# Patient Record
Sex: Female | Born: 1960 | Race: White | Hispanic: No | Marital: Married | Smoking: Former smoker
Health system: Southern US, Community
[De-identification: ages and names within clinical notes are randomized; demographics above are authoritative.]

## PROBLEM LIST (undated history)

## (undated) DIAGNOSIS — D45 Polycythemia vera: Secondary | ICD-10-CM

## (undated) HISTORY — PX: APPENDECTOMY: SHX54

## (undated) HISTORY — PX: TONSILLECTOMY: SUR1361

## (undated) HISTORY — DX: Polycythemia vera: D45

---

## 2007-04-08 ENCOUNTER — Ambulatory Visit: Payer: Self-pay | Admitting: Internal Medicine

## 2009-12-15 ENCOUNTER — Ambulatory Visit: Payer: Self-pay | Admitting: Internal Medicine

## 2009-12-16 ENCOUNTER — Ambulatory Visit: Payer: Self-pay | Admitting: Internal Medicine

## 2010-01-14 ENCOUNTER — Ambulatory Visit: Payer: Self-pay | Admitting: Internal Medicine

## 2010-02-14 ENCOUNTER — Ambulatory Visit: Payer: Self-pay | Admitting: Internal Medicine

## 2010-03-16 ENCOUNTER — Ambulatory Visit: Payer: Self-pay | Admitting: Internal Medicine

## 2010-03-28 ENCOUNTER — Ambulatory Visit: Payer: Self-pay | Admitting: Internal Medicine

## 2010-04-16 ENCOUNTER — Ambulatory Visit: Payer: Self-pay | Admitting: Internal Medicine

## 2010-05-23 ENCOUNTER — Ambulatory Visit: Payer: Self-pay | Admitting: Internal Medicine

## 2010-06-15 ENCOUNTER — Ambulatory Visit: Payer: Self-pay | Admitting: Internal Medicine

## 2010-07-25 ENCOUNTER — Ambulatory Visit: Payer: Self-pay | Admitting: Internal Medicine

## 2010-08-15 ENCOUNTER — Ambulatory Visit: Payer: Self-pay | Admitting: Internal Medicine

## 2010-09-15 ENCOUNTER — Ambulatory Visit: Payer: Self-pay | Admitting: Internal Medicine

## 2010-12-05 ENCOUNTER — Ambulatory Visit: Payer: Self-pay | Admitting: Internal Medicine

## 2010-12-16 ENCOUNTER — Ambulatory Visit: Payer: Self-pay | Admitting: Internal Medicine

## 2011-03-07 ENCOUNTER — Ambulatory Visit: Payer: Self-pay | Admitting: Internal Medicine

## 2011-03-17 ENCOUNTER — Ambulatory Visit: Payer: Self-pay | Admitting: Internal Medicine

## 2011-05-14 ENCOUNTER — Ambulatory Visit: Payer: Self-pay | Admitting: Internal Medicine

## 2011-06-07 ENCOUNTER — Ambulatory Visit: Payer: Self-pay | Admitting: Internal Medicine

## 2011-06-07 LAB — CBC CANCER CENTER
Basophil #: 0.1 x10 3/mm (ref 0.0–0.1)
Basophil %: 0.9 %
Eosinophil #: 0.1 x10 3/mm (ref 0.0–0.7)
HCT: 42.9 % (ref 35.0–47.0)
HGB: 14.1 g/dL (ref 12.0–16.0)
Lymphocyte #: 1 x10 3/mm (ref 1.0–3.6)
MCH: 34.3 pg — ABNORMAL HIGH (ref 26.0–34.0)
MCHC: 32.8 g/dL (ref 32.0–36.0)
MCV: 104.4 fL — ABNORMAL HIGH (ref 80–100)
Monocyte #: 0.3 x10 3/mm (ref 0.0–0.7)
Monocyte %: 5.4 %
Neutrophil #: 4.9 x10 3/mm (ref 1.4–6.5)
RDW: 12.3 % (ref 11.5–14.5)
WBC: 6.4 x10 3/mm (ref 3.6–11.0)

## 2011-06-07 LAB — COMPREHENSIVE METABOLIC PANEL
Alkaline Phosphatase: 88 U/L (ref 50–136)
Bilirubin,Total: 0.9 mg/dL (ref 0.2–1.0)
Calcium, Total: 8.7 mg/dL (ref 8.5–10.1)
Co2: 31 mmol/L (ref 21–32)
Creatinine: 0.8 mg/dL (ref 0.60–1.30)
EGFR (Non-African Amer.): >60
Glucose: 82 mg/dL (ref 65–99)
SGPT (ALT): 47 U/L
Sodium: 140 mmol/L (ref 136–145)
Total Protein: 6.8 g/dL (ref 6.4–8.2)

## 2011-06-15 ENCOUNTER — Ambulatory Visit: Payer: Self-pay | Admitting: Internal Medicine

## 2011-07-19 ENCOUNTER — Ambulatory Visit: Payer: Self-pay | Admitting: Internal Medicine

## 2011-07-19 LAB — CBC CANCER CENTER
Eosinophil #: 0.1 x10 3/mm (ref 0.0–0.7)
HCT: 44.7 % (ref 35.0–47.0)
Lymphocyte #: 0.9 x10 3/mm — ABNORMAL LOW (ref 1.0–3.6)
MCH: 34.7 pg — ABNORMAL HIGH (ref 26.0–34.0)
MCV: 104.4 fL — ABNORMAL HIGH (ref 80–100)
Monocyte #: 0.4 x10 3/mm (ref 0.0–0.7)
Neutrophil %: 79.1 %
Platelet: 439 x10 3/mm (ref 150–440)
RBC: 4.28 10*6/uL (ref 3.80–5.20)
RDW: 12.3 % (ref 11.5–14.5)
WBC: 7.3 x10 3/mm (ref 3.6–11.0)

## 2011-08-15 ENCOUNTER — Ambulatory Visit: Payer: Self-pay | Admitting: Internal Medicine

## 2011-08-30 LAB — CBC WITH DIFFERENTIAL/PLATELET
Eosinophil %: 1.7 %
HGB: 14.3 g/dL (ref 12.0–16.0)
Lymphocyte #: 1 10*3/uL (ref 1.0–3.6)
Lymphocyte %: 14.8 %
MCHC: 34.9 g/dL (ref 32.0–36.0)
MCV: 103 fL — ABNORMAL HIGH (ref 80–100)
Monocyte #: 0.4 x10 3/mm (ref 0.2–0.9)
Monocyte %: 6 %
RDW: 12.5 % (ref 11.5–14.5)

## 2011-08-30 LAB — HEPATIC FUNCTION PANEL A (ARMC)
Albumin: 3.8 g/dL (ref 3.4–5.0)
Alkaline Phosphatase: 99 U/L (ref 50–136)
Bilirubin,Total: 0.8 mg/dL (ref 0.2–1.0)
SGOT(AST): 25 U/L (ref 15–37)
Total Protein: 6.7 g/dL (ref 6.4–8.2)

## 2011-08-30 LAB — CREATININE, SERUM
Creatinine: 0.79 mg/dL (ref 0.60–1.30)
EGFR (Non-African Amer.): >60

## 2011-09-15 ENCOUNTER — Ambulatory Visit: Payer: Self-pay | Admitting: Internal Medicine

## 2011-10-11 LAB — CBC CANCER CENTER
Basophil #: 0.1 x10 3/mm (ref 0.0–0.1)
Basophil %: 0.8 %
Eosinophil %: 1.2 %
HCT: 42.1 % (ref 35.0–47.0)
MCHC: 34.6 g/dL (ref 32.0–36.0)
MCV: 104 fL — ABNORMAL HIGH (ref 80–100)
Monocyte #: 0.5 x10 3/mm (ref 0.2–0.9)
Monocyte %: 6.5 %
Neutrophil #: 5.6 x10 3/mm (ref 1.4–6.5)
Neutrophil %: 78.4 %
RBC: 4.05 10*6/uL (ref 3.80–5.20)
RDW: 12.8 % (ref 11.5–14.5)
WBC: 7.1 x10 3/mm (ref 3.6–11.0)

## 2011-10-15 ENCOUNTER — Ambulatory Visit: Payer: Self-pay | Admitting: Internal Medicine

## 2011-11-22 ENCOUNTER — Ambulatory Visit: Payer: Self-pay | Admitting: Internal Medicine

## 2011-11-22 LAB — CBC CANCER CENTER
Basophil %: 0.7 %
Eosinophil %: 1.4 %
HCT: 43.4 % (ref 35.0–47.0)
HGB: 14.7 g/dL (ref 12.0–16.0)
Lymphocyte #: 0.9 x10 3/mm — ABNORMAL LOW (ref 1.0–3.6)
MCH: 35.2 pg — ABNORMAL HIGH (ref 26.0–34.0)
MCV: 104 fL — ABNORMAL HIGH (ref 80–100)
Monocyte #: 0.3 x10 3/mm (ref 0.2–0.9)
Monocyte %: 4.6 %
Platelet: 440 x10 3/mm (ref 150–440)
RBC: 4.17 10*6/uL (ref 3.80–5.20)
WBC: 7.3 x10 3/mm (ref 3.6–11.0)

## 2011-11-22 LAB — COMPREHENSIVE METABOLIC PANEL
Alkaline Phosphatase: 88 U/L (ref 50–136)
Anion Gap: 4 — ABNORMAL LOW (ref 7–16)
BUN: 13 mg/dL (ref 7–18)
Calcium, Total: 9 mg/dL (ref 8.5–10.1)
Chloride: 103 mmol/L (ref 98–107)
Co2: 31 mmol/L (ref 21–32)
Creatinine: 0.84 mg/dL (ref 0.60–1.30)
EGFR (African American): >60
EGFR (Non-African Amer.): >60
Osmolality: 277 (ref 275–301)
Potassium: 4.4 mmol/L (ref 3.5–5.1)
Sodium: 138 mmol/L (ref 136–145)
Total Protein: 6.9 g/dL (ref 6.4–8.2)

## 2011-12-16 ENCOUNTER — Ambulatory Visit: Payer: Self-pay | Admitting: Internal Medicine

## 2012-01-03 LAB — CBC CANCER CENTER
Eosinophil #: 0.1 x10 3/mm (ref 0.0–0.7)
Eosinophil %: 1 %
HGB: 14.6 g/dL (ref 12.0–16.0)
Lymphocyte #: 1 x10 3/mm (ref 1.0–3.6)
MCH: 35 pg — ABNORMAL HIGH (ref 26.0–34.0)
MCV: 102 fL — ABNORMAL HIGH (ref 80–100)
Monocyte #: 0.5 x10 3/mm (ref 0.2–0.9)
Neutrophil #: 6 x10 3/mm (ref 1.4–6.5)
Neutrophil %: 78.7 %
Platelet: 405 x10 3/mm (ref 150–440)
WBC: 7.6 x10 3/mm (ref 3.6–11.0)

## 2012-01-15 ENCOUNTER — Ambulatory Visit: Payer: Self-pay | Admitting: Internal Medicine

## 2012-02-14 LAB — CBC CANCER CENTER
Basophil #: 0.1 x10 3/mm (ref 0.0–0.1)
Basophil %: 0.7 %
Eosinophil #: 0.1 x10 3/mm (ref 0.0–0.7)
HCT: 42.5 % (ref 35.0–47.0)
HGB: 14.7 g/dL (ref 12.0–16.0)
Lymphocyte #: 1.1 x10 3/mm (ref 1.0–3.6)
Lymphocyte %: 13.5 %
MCH: 35.3 pg — ABNORMAL HIGH (ref 26.0–34.0)
MCHC: 34.5 g/dL (ref 32.0–36.0)
MCV: 103 fL — ABNORMAL HIGH (ref 80–100)
Monocyte #: 0.5 x10 3/mm (ref 0.2–0.9)
Neutrophil #: 6.2 x10 3/mm (ref 1.4–6.5)
RDW: 12.9 % (ref 11.5–14.5)

## 2012-02-14 LAB — CREATININE, SERUM: Creatinine: 0.83 mg/dL (ref 0.60–1.30)

## 2012-02-14 LAB — HEPATIC FUNCTION PANEL A (ARMC)
Alkaline Phosphatase: 75 U/L (ref 50–136)
Bilirubin, Direct: 0.2 mg/dL (ref 0.00–0.20)
Bilirubin,Total: 0.6 mg/dL (ref 0.2–1.0)
Total Protein: 6.8 g/dL (ref 6.4–8.2)

## 2012-02-15 ENCOUNTER — Ambulatory Visit: Payer: Self-pay | Admitting: Internal Medicine

## 2012-03-27 ENCOUNTER — Ambulatory Visit: Payer: Self-pay | Admitting: Internal Medicine

## 2012-03-27 LAB — CBC CANCER CENTER
Basophil %: 0.8 %
Eosinophil #: 0.1 x10 3/mm (ref 0.0–0.7)
Eosinophil %: 1.8 %
HGB: 15.1 g/dL (ref 12.0–16.0)
Lymphocyte %: 14.6 %
MCHC: 34.6 g/dL (ref 32.0–36.0)
Neutrophil %: 77.1 %
RBC: 4.35 10*6/uL (ref 3.80–5.20)
WBC: 6.8 x10 3/mm (ref 3.6–11.0)

## 2012-04-16 ENCOUNTER — Ambulatory Visit: Payer: Self-pay | Admitting: Internal Medicine

## 2012-05-08 LAB — COMPREHENSIVE METABOLIC PANEL
Albumin: 3.9 g/dL (ref 3.4–5.0)
Anion Gap: 6 — ABNORMAL LOW (ref 7–16)
BUN: 14 mg/dL (ref 7–18)
Bilirubin,Total: 0.7 mg/dL (ref 0.2–1.0)
Chloride: 104 mmol/L (ref 98–107)
EGFR (Non-African Amer.): >60
Glucose: 118 mg/dL — ABNORMAL HIGH (ref 65–99)
Potassium: 4.5 mmol/L (ref 3.5–5.1)
SGPT (ALT): 44 U/L (ref 12–78)
Sodium: 141 mmol/L (ref 136–145)

## 2012-05-08 LAB — CBC CANCER CENTER
Basophil #: 0.1 x10 3/mm (ref 0.0–0.1)
Basophil %: 0.8 %
MCH: 35.4 pg — ABNORMAL HIGH (ref 26.0–34.0)
MCV: 101 fL — ABNORMAL HIGH (ref 80–100)
Monocyte #: 0.4 x10 3/mm (ref 0.2–0.9)
Monocyte %: 5.6 %
RBC: 4.18 10*6/uL (ref 3.80–5.20)
RDW: 13.1 % (ref 11.5–14.5)

## 2012-05-17 ENCOUNTER — Ambulatory Visit: Payer: Self-pay | Admitting: Internal Medicine

## 2012-05-23 ENCOUNTER — Ambulatory Visit: Payer: Self-pay | Admitting: Internal Medicine

## 2012-06-19 ENCOUNTER — Ambulatory Visit: Payer: Self-pay | Admitting: Internal Medicine

## 2012-06-19 LAB — CBC CANCER CENTER
Basophil #: 0.1 x10 3/mm (ref 0.0–0.1)
Basophil %: 0.8 %
Eosinophil %: 1.3 %
HCT: 42.8 % (ref 35.0–47.0)
HGB: 14.9 g/dL (ref 12.0–16.0)
Lymphocyte %: 12.3 %
MCHC: 34.8 g/dL (ref 32.0–36.0)
Monocyte %: 4.8 %
Neutrophil #: 5.3 x10 3/mm (ref 1.4–6.5)
WBC: 6.5 x10 3/mm (ref 3.6–11.0)

## 2012-07-15 ENCOUNTER — Ambulatory Visit: Payer: Self-pay | Admitting: Internal Medicine

## 2012-07-31 LAB — CBC CANCER CENTER
Basophil #: 0 x10 3/mm (ref 0.0–0.1)
Basophil %: 0.5 %
Eosinophil #: 0.1 x10 3/mm (ref 0.0–0.7)
HCT: 43.3 % (ref 35.0–47.0)
Lymphocyte #: 1 x10 3/mm (ref 1.0–3.6)
MCHC: 34.1 g/dL (ref 32.0–36.0)
MCV: 103 fL — ABNORMAL HIGH (ref 80–100)
Monocyte #: 0.4 x10 3/mm (ref 0.2–0.9)
Monocyte %: 5.7 %
Neutrophil %: 78.5 %
RBC: 4.21 10*6/uL (ref 3.80–5.20)
RDW: 12.8 % (ref 11.5–14.5)
WBC: 7.2 x10 3/mm (ref 3.6–11.0)

## 2012-07-31 LAB — HEPATIC FUNCTION PANEL A (ARMC)
Albumin: 3.9 g/dL (ref 3.4–5.0)
Alkaline Phosphatase: 82 U/L (ref 50–136)
SGPT (ALT): 50 U/L (ref 12–78)
Total Protein: 6.8 g/dL (ref 6.4–8.2)

## 2012-07-31 LAB — CREATININE, SERUM: Creatinine: 0.8 mg/dL (ref 0.60–1.30)

## 2012-08-14 ENCOUNTER — Ambulatory Visit: Payer: Self-pay | Admitting: Internal Medicine

## 2012-09-11 LAB — CBC CANCER CENTER
Basophil #: 0 x10 3/mm (ref 0.0–0.1)
Basophil %: 0.6 %
Eosinophil #: 0.1 x10 3/mm (ref 0.0–0.7)
Eosinophil %: 1.4 %
HCT: 42.5 % (ref 35.0–47.0)
HGB: 14.3 g/dL (ref 12.0–16.0)
Lymphocyte #: 0.9 x10 3/mm — ABNORMAL LOW (ref 1.0–3.6)
MCHC: 33.7 g/dL (ref 32.0–36.0)
MCV: 103 fL — ABNORMAL HIGH (ref 80–100)
Neutrophil #: 5.6 x10 3/mm (ref 1.4–6.5)
Neutrophil %: 79.4 %
RBC: 4.14 10*6/uL (ref 3.80–5.20)
RDW: 12.2 % (ref 11.5–14.5)
WBC: 7 x10 3/mm (ref 3.6–11.0)

## 2012-09-14 ENCOUNTER — Ambulatory Visit: Payer: Self-pay | Admitting: Internal Medicine

## 2012-10-23 ENCOUNTER — Ambulatory Visit: Payer: Self-pay | Admitting: Internal Medicine

## 2012-10-23 LAB — CBC CANCER CENTER
Basophil #: 0 x10 3/mm (ref 0.0–0.1)
Basophil %: 0.4 %
HCT: 44.8 % (ref 35.0–47.0)
HGB: 15.5 g/dL (ref 12.0–16.0)
Lymphocyte #: 1 x10 3/mm (ref 1.0–3.6)
Lymphocyte %: 12.6 %
MCH: 35.3 pg — ABNORMAL HIGH (ref 26.0–34.0)
MCHC: 34.6 g/dL (ref 32.0–36.0)
MCV: 102 fL — ABNORMAL HIGH (ref 80–100)
Monocyte #: 0.4 x10 3/mm (ref 0.2–0.9)
Neutrophil #: 6.4 x10 3/mm (ref 1.4–6.5)
Platelet: 428 x10 3/mm (ref 150–440)
RBC: 4.39 10*6/uL (ref 3.80–5.20)
RDW: 12.4 % (ref 11.5–14.5)
WBC: 8 x10 3/mm (ref 3.6–11.0)

## 2012-10-23 LAB — COMPREHENSIVE METABOLIC PANEL
Alkaline Phosphatase: 69 U/L (ref 50–136)
Calcium, Total: 8.8 mg/dL (ref 8.5–10.1)
Chloride: 105 mmol/L (ref 98–107)
Co2: 30 mmol/L (ref 21–32)
Creatinine: 0.89 mg/dL (ref 0.60–1.30)
EGFR (African American): >60
EGFR (Non-African Amer.): >60
Osmolality: 282 (ref 275–301)
SGOT(AST): 19 U/L (ref 15–37)
Sodium: 142 mmol/L (ref 136–145)
Total Protein: 7.1 g/dL (ref 6.4–8.2)

## 2012-11-14 ENCOUNTER — Ambulatory Visit: Payer: Self-pay | Admitting: Internal Medicine

## 2012-12-04 LAB — CBC CANCER CENTER
Basophil #: 0.1 x10 3/mm (ref 0.0–0.1)
Basophil %: 0.9 %
Eosinophil #: 0.1 x10 3/mm (ref 0.0–0.7)
Eosinophil %: 1.7 %
HCT: 42.4 % (ref 35.0–47.0)
HGB: 14.6 g/dL (ref 12.0–16.0)
Lymphocyte #: 1.4 x10 3/mm (ref 1.0–3.6)
Lymphocyte %: 21.3 %
MCH: 34.6 pg — ABNORMAL HIGH (ref 26.0–34.0)
MCHC: 34.4 g/dL (ref 32.0–36.0)
MCV: 101 fL — ABNORMAL HIGH (ref 80–100)
Monocyte #: 0.6 x10 3/mm (ref 0.2–0.9)
Neutrophil %: 67.3 %
RBC: 4.22 10*6/uL (ref 3.80–5.20)
RDW: 12.9 % (ref 11.5–14.5)
WBC: 6.4 x10 3/mm (ref 3.6–11.0)

## 2012-12-15 ENCOUNTER — Ambulatory Visit: Payer: Self-pay | Admitting: Internal Medicine

## 2013-01-15 ENCOUNTER — Ambulatory Visit: Payer: Self-pay | Admitting: Internal Medicine

## 2013-01-15 LAB — CBC CANCER CENTER
Basophil #: 0.1 x10 3/mm (ref 0.0–0.1)
Basophil %: 0.9 %
HCT: 45.5 % (ref 35.0–47.0)
HGB: 15.6 g/dL (ref 12.0–16.0)
Lymphocyte #: 1.3 x10 3/mm (ref 1.0–3.6)
Lymphocyte %: 15.7 %
MCV: 100 fL (ref 80–100)
Monocyte %: 6.1 %
Neutrophil %: 75.6 %
WBC: 8.3 x10 3/mm (ref 3.6–11.0)

## 2013-01-15 LAB — CREATININE, SERUM
Creatinine: 0.86 mg/dL (ref 0.60–1.30)
EGFR (African American): >60

## 2013-01-15 LAB — HEPATIC FUNCTION PANEL A (ARMC)
Albumin: 3.9 g/dL (ref 3.4–5.0)
Alkaline Phosphatase: 83 U/L (ref 50–136)
Bilirubin, Direct: 0.1 mg/dL (ref 0.00–0.20)
Bilirubin,Total: 0.6 mg/dL (ref 0.2–1.0)
Total Protein: 6.9 g/dL (ref 6.4–8.2)

## 2013-02-14 ENCOUNTER — Ambulatory Visit: Payer: Self-pay | Admitting: Internal Medicine

## 2013-02-26 LAB — CBC CANCER CENTER
Basophil %: 0.7 %
Eosinophil #: 0.1 x10 3/mm (ref 0.0–0.7)
Eosinophil %: 1.1 %
HGB: 15.8 g/dL (ref 12.0–16.0)
Lymphocyte #: 1.1 x10 3/mm (ref 1.0–3.6)
Lymphocyte %: 10.9 %
MCH: 33.6 pg (ref 26.0–34.0)
MCV: 101 fL — ABNORMAL HIGH (ref 80–100)
Monocyte #: 0.6 x10 3/mm (ref 0.2–0.9)
Neutrophil #: 8.3 x10 3/mm — ABNORMAL HIGH (ref 1.4–6.5)
RDW: 12.2 % (ref 11.5–14.5)
WBC: 10.2 x10 3/mm (ref 3.6–11.0)

## 2013-03-16 ENCOUNTER — Ambulatory Visit: Payer: Self-pay | Admitting: Internal Medicine

## 2013-04-23 ENCOUNTER — Ambulatory Visit: Payer: Self-pay | Admitting: Internal Medicine

## 2013-04-23 LAB — COMPREHENSIVE METABOLIC PANEL
ANION GAP: 7 (ref 7–16)
AST: 22 U/L (ref 15–37)
Albumin: 4 g/dL (ref 3.4–5.0)
Alkaline Phosphatase: 87 U/L
BILIRUBIN TOTAL: 0.5 mg/dL (ref 0.2–1.0)
BUN: 13 mg/dL (ref 7–18)
Calcium, Total: 9.3 mg/dL (ref 8.5–10.1)
Chloride: 102 mmol/L (ref 98–107)
Co2: 32 mmol/L (ref 21–32)
Creatinine: 0.79 mg/dL (ref 0.60–1.30)
EGFR (African American): >60
EGFR (Non-African Amer.): >60
Glucose: 101 mg/dL — ABNORMAL HIGH (ref 65–99)
OSMOLALITY: 282 (ref 275–301)
Potassium: 4.1 mmol/L (ref 3.5–5.1)
SGPT (ALT): 40 U/L (ref 12–78)
SODIUM: 141 mmol/L (ref 136–145)
Total Protein: 6.9 g/dL (ref 6.4–8.2)

## 2013-04-23 LAB — CBC CANCER CENTER
BASOS ABS: 0.1 x10 3/mm (ref 0.0–0.1)
BASOS PCT: 0.7 %
Eosinophil #: 0.2 x10 3/mm (ref 0.0–0.7)
Eosinophil %: 2.5 %
HCT: 45.7 % (ref 35.0–47.0)
HGB: 15.4 g/dL (ref 12.0–16.0)
Lymphocyte #: 1.3 x10 3/mm (ref 1.0–3.6)
Lymphocyte %: 16.2 %
MCH: 33.1 pg (ref 26.0–34.0)
MCHC: 33.7 g/dL (ref 32.0–36.0)
MCV: 98 fL (ref 80–100)
Monocyte #: 0.5 x10 3/mm (ref 0.2–0.9)
Monocyte %: 6.3 %
NEUTROS PCT: 74.3 %
Neutrophil #: 6 x10 3/mm (ref 1.4–6.5)
PLATELETS: 476 x10 3/mm — AB (ref 150–440)
RBC: 4.67 10*6/uL (ref 3.80–5.20)
RDW: 12.8 % (ref 11.5–14.5)
WBC: 8.1 x10 3/mm (ref 3.6–11.0)

## 2013-05-14 LAB — CBC CANCER CENTER
Basophil #: 0.1 x10 3/mm (ref 0.0–0.1)
Basophil %: 1.2 %
EOS ABS: 0.2 x10 3/mm (ref 0.0–0.7)
Eosinophil %: 2.3 %
HCT: 43.7 % (ref 35.0–47.0)
HGB: 14.8 g/dL (ref 12.0–16.0)
LYMPHS ABS: 1.1 x10 3/mm (ref 1.0–3.6)
Lymphocyte %: 17 %
MCH: 32.9 pg (ref 26.0–34.0)
MCHC: 33.8 g/dL (ref 32.0–36.0)
MCV: 97 fL (ref 80–100)
MONO ABS: 0.4 x10 3/mm (ref 0.2–0.9)
MONOS PCT: 5.9 %
NEUTROS ABS: 4.9 x10 3/mm (ref 1.4–6.5)
NEUTROS PCT: 73.6 %
Platelet: 294 x10 3/mm (ref 150–440)
RBC: 4.49 10*6/uL (ref 3.80–5.20)
RDW: 13.2 % (ref 11.5–14.5)
WBC: 6.7 x10 3/mm (ref 3.6–11.0)

## 2013-05-17 ENCOUNTER — Ambulatory Visit: Payer: Self-pay | Admitting: Internal Medicine

## 2013-05-25 ENCOUNTER — Ambulatory Visit: Payer: Self-pay | Admitting: Internal Medicine

## 2013-06-04 ENCOUNTER — Ambulatory Visit: Payer: Self-pay | Admitting: Internal Medicine

## 2013-06-04 LAB — CBC CANCER CENTER
Basophil #: 0.1 x10 3/mm (ref 0.0–0.1)
Basophil %: 0.8 %
Eosinophil #: 0.2 x10 3/mm (ref 0.0–0.7)
Eosinophil %: 2 %
HCT: 42.8 % (ref 35.0–47.0)
HGB: 14.3 g/dL (ref 12.0–16.0)
Lymphocyte #: 1.1 x10 3/mm (ref 1.0–3.6)
Lymphocyte %: 13.7 %
MCH: 32.2 pg (ref 26.0–34.0)
MCHC: 33.3 g/dL (ref 32.0–36.0)
MCV: 97 fL (ref 80–100)
MONO ABS: 0.5 x10 3/mm (ref 0.2–0.9)
Monocyte %: 6.8 %
Neutrophil #: 6.2 x10 3/mm (ref 1.4–6.5)
Neutrophil %: 76.7 %
PLATELETS: 298 x10 3/mm (ref 150–440)
RBC: 4.43 10*6/uL (ref 3.80–5.20)
RDW: 13.3 % (ref 11.5–14.5)
WBC: 8 x10 3/mm (ref 3.6–11.0)

## 2013-06-14 ENCOUNTER — Ambulatory Visit: Payer: Self-pay | Admitting: Internal Medicine

## 2013-06-16 ENCOUNTER — Ambulatory Visit: Payer: Self-pay | Admitting: Gastroenterology

## 2013-06-17 LAB — PATHOLOGY REPORT

## 2013-06-25 LAB — CBC CANCER CENTER
Basophil #: 0.1 x10 3/mm (ref 0.0–0.1)
Basophil %: 0.7 %
EOS ABS: 0.1 x10 3/mm (ref 0.0–0.7)
EOS PCT: 1.5 %
HCT: 44.4 % (ref 35.0–47.0)
HGB: 14.5 g/dL (ref 12.0–16.0)
LYMPHS ABS: 1 x10 3/mm (ref 1.0–3.6)
Lymphocyte %: 13.5 %
MCH: 31.3 pg (ref 26.0–34.0)
MCHC: 32.6 g/dL (ref 32.0–36.0)
MCV: 96 fL (ref 80–100)
Monocyte #: 0.5 x10 3/mm (ref 0.2–0.9)
Monocyte %: 6.3 %
NEUTROS PCT: 78 %
Neutrophil #: 5.7 x10 3/mm (ref 1.4–6.5)
Platelet: 296 x10 3/mm (ref 150–440)
RBC: 4.62 10*6/uL (ref 3.80–5.20)
RDW: 13.1 % (ref 11.5–14.5)
WBC: 7.3 x10 3/mm (ref 3.6–11.0)

## 2013-07-15 ENCOUNTER — Ambulatory Visit: Payer: Self-pay | Admitting: Internal Medicine

## 2013-07-16 LAB — CBC CANCER CENTER
BASOS PCT: 0.7 %
Basophil #: 0.1 x10 3/mm (ref 0.0–0.1)
EOS ABS: 0.1 x10 3/mm (ref 0.0–0.7)
Eosinophil %: 2 %
HCT: 42.7 % (ref 35.0–47.0)
HGB: 13.9 g/dL (ref 12.0–16.0)
LYMPHS ABS: 1.1 x10 3/mm (ref 1.0–3.6)
LYMPHS PCT: 14.7 %
MCH: 31 pg (ref 26.0–34.0)
MCHC: 32.7 g/dL (ref 32.0–36.0)
MCV: 95 fL (ref 80–100)
MONO ABS: 0.4 x10 3/mm (ref 0.2–0.9)
Monocyte %: 5.7 %
NEUTROS ABS: 5.7 x10 3/mm (ref 1.4–6.5)
NEUTROS PCT: 76.9 %
Platelet: 254 x10 3/mm (ref 150–440)
RBC: 4.49 10*6/uL (ref 3.80–5.20)
RDW: 13.8 % (ref 11.5–14.5)
WBC: 7.4 x10 3/mm (ref 3.6–11.0)

## 2013-07-16 LAB — HEPATIC FUNCTION PANEL A (ARMC)
ALK PHOS: 74 U/L
ALT: 28 U/L (ref 12–78)
Albumin: 3.6 g/dL (ref 3.4–5.0)
BILIRUBIN DIRECT: 0.1 mg/dL (ref 0.00–0.20)
BILIRUBIN TOTAL: 0.4 mg/dL (ref 0.2–1.0)
SGOT(AST): 17 U/L (ref 15–37)
Total Protein: 6.5 g/dL (ref 6.4–8.2)

## 2013-07-16 LAB — CREATININE, SERUM
Creatinine: 0.74 mg/dL (ref 0.60–1.30)
EGFR (African American): >60
EGFR (Non-African Amer.): >60

## 2013-08-06 LAB — CBC CANCER CENTER
Basophil #: 0.1 x10 3/mm (ref 0.0–0.1)
Basophil %: 1 %
Eosinophil #: 0.2 x10 3/mm (ref 0.0–0.7)
Eosinophil %: 1.8 %
HCT: 45.3 % (ref 35.0–47.0)
HGB: 14.7 g/dL (ref 12.0–16.0)
LYMPHS PCT: 14.9 %
Lymphocyte #: 1.3 x10 3/mm (ref 1.0–3.6)
MCH: 31.2 pg (ref 26.0–34.0)
MCHC: 32.4 g/dL (ref 32.0–36.0)
MCV: 96 fL (ref 80–100)
MONO ABS: 0.4 x10 3/mm (ref 0.2–0.9)
Monocyte %: 4.7 %
NEUTROS ABS: 6.9 x10 3/mm — AB (ref 1.4–6.5)
NEUTROS PCT: 77.6 %
Platelet: 312 x10 3/mm (ref 150–440)
RBC: 4.72 10*6/uL (ref 3.80–5.20)
RDW: 14.2 % (ref 11.5–14.5)
WBC: 8.9 x10 3/mm (ref 3.6–11.0)

## 2013-08-14 ENCOUNTER — Ambulatory Visit: Payer: Self-pay | Admitting: Internal Medicine

## 2013-08-27 ENCOUNTER — Ambulatory Visit: Payer: Self-pay | Admitting: Internal Medicine

## 2013-09-17 ENCOUNTER — Ambulatory Visit: Payer: Self-pay | Admitting: Internal Medicine

## 2013-09-17 LAB — CBC CANCER CENTER
BASOS ABS: 0.1 x10 3/mm (ref 0.0–0.1)
BASOS PCT: 1 %
Eosinophil #: 0.1 x10 3/mm (ref 0.0–0.7)
Eosinophil %: 1.5 %
HCT: 41.7 % (ref 35.0–47.0)
HGB: 13.7 g/dL (ref 12.0–16.0)
Lymphocyte #: 1.2 x10 3/mm (ref 1.0–3.6)
Lymphocyte %: 14.4 %
MCH: 31.8 pg (ref 26.0–34.0)
MCHC: 32.9 g/dL (ref 32.0–36.0)
MCV: 97 fL (ref 80–100)
Monocyte #: 0.5 x10 3/mm (ref 0.2–0.9)
Monocyte %: 5.4 %
NEUTROS ABS: 6.6 x10 3/mm — AB (ref 1.4–6.5)
Neutrophil %: 77.7 %
Platelet: 392 x10 3/mm (ref 150–440)
RBC: 4.3 10*6/uL (ref 3.80–5.20)
RDW: 15 % — AB (ref 11.5–14.5)
WBC: 8.5 x10 3/mm (ref 3.6–11.0)

## 2013-10-08 LAB — COMPREHENSIVE METABOLIC PANEL
ALBUMIN: 3.7 g/dL (ref 3.4–5.0)
ALK PHOS: 85 U/L
ALT: 28 U/L (ref 12–78)
Anion Gap: 6 — ABNORMAL LOW (ref 7–16)
BILIRUBIN TOTAL: 0.6 mg/dL (ref 0.2–1.0)
BUN: 11 mg/dL (ref 7–18)
CHLORIDE: 103 mmol/L (ref 98–107)
CO2: 31 mmol/L (ref 21–32)
Calcium, Total: 8.4 mg/dL — ABNORMAL LOW (ref 8.5–10.1)
Creatinine: 0.77 mg/dL (ref 0.60–1.30)
EGFR (African American): >60
EGFR (Non-African Amer.): >60
Glucose: 98 mg/dL (ref 65–99)
Osmolality: 279 (ref 275–301)
POTASSIUM: 4.3 mmol/L (ref 3.5–5.1)
SGOT(AST): 20 U/L (ref 15–37)
SODIUM: 140 mmol/L (ref 136–145)
Total Protein: 6.7 g/dL (ref 6.4–8.2)

## 2013-10-08 LAB — CBC CANCER CENTER
BASOS PCT: 0.8 %
Basophil #: 0.1 x10 3/mm (ref 0.0–0.1)
Eosinophil #: 0.1 x10 3/mm (ref 0.0–0.7)
Eosinophil %: 1.4 %
HCT: 41.9 % (ref 35.0–47.0)
HGB: 13.8 g/dL (ref 12.0–16.0)
LYMPHS ABS: 1.2 x10 3/mm (ref 1.0–3.6)
LYMPHS PCT: 16.7 %
MCH: 32.9 pg (ref 26.0–34.0)
MCHC: 32.9 g/dL (ref 32.0–36.0)
MCV: 100 fL (ref 80–100)
Monocyte #: 0.4 x10 3/mm (ref 0.2–0.9)
Monocyte %: 5.8 %
NEUTROS ABS: 5.3 x10 3/mm (ref 1.4–6.5)
Neutrophil %: 75.3 %
Platelet: 252 x10 3/mm (ref 150–440)
RBC: 4.2 10*6/uL (ref 3.80–5.20)
RDW: 16.7 % — AB (ref 11.5–14.5)
WBC: 7 x10 3/mm (ref 3.6–11.0)

## 2013-10-14 ENCOUNTER — Ambulatory Visit: Payer: Self-pay | Admitting: Internal Medicine

## 2013-11-19 ENCOUNTER — Ambulatory Visit: Payer: Self-pay | Admitting: Internal Medicine

## 2013-11-19 LAB — CBC CANCER CENTER
Basophil #: 0.1 x10 3/mm (ref 0.0–0.1)
Basophil %: 0.8 %
Eosinophil #: 0.2 x10 3/mm (ref 0.0–0.7)
Eosinophil %: 1.9 %
HCT: 43 % (ref 35.0–47.0)
HGB: 14.5 g/dL (ref 12.0–16.0)
Lymphocyte #: 1.5 x10 3/mm (ref 1.0–3.6)
Lymphocyte %: 18.4 %
MCH: 34.7 pg — ABNORMAL HIGH (ref 26.0–34.0)
MCHC: 33.7 g/dL (ref 32.0–36.0)
MCV: 103 fL — ABNORMAL HIGH (ref 80–100)
MONO ABS: 0.7 x10 3/mm (ref 0.2–0.9)
Monocyte %: 8.1 %
NEUTROS PCT: 70.8 %
Neutrophil #: 5.7 x10 3/mm (ref 1.4–6.5)
Platelet: 289 x10 3/mm (ref 150–440)
RBC: 4.19 10*6/uL (ref 3.80–5.20)
RDW: 15 % — AB (ref 11.5–14.5)
WBC: 8.1 x10 3/mm (ref 3.6–11.0)

## 2013-12-15 ENCOUNTER — Ambulatory Visit: Payer: Self-pay | Admitting: Internal Medicine

## 2013-12-31 LAB — CBC CANCER CENTER
BASOS PCT: 0.5 %
Basophil #: 0 x10 3/mm (ref 0.0–0.1)
Eosinophil #: 0.1 x10 3/mm (ref 0.0–0.7)
Eosinophil %: 0.9 %
HCT: 46.6 % (ref 35.0–47.0)
HGB: 15.6 g/dL (ref 12.0–16.0)
Lymphocyte #: 1.3 x10 3/mm (ref 1.0–3.6)
Lymphocyte %: 14.6 %
MCH: 35.5 pg — AB (ref 26.0–34.0)
MCHC: 33.4 g/dL (ref 32.0–36.0)
MCV: 106 fL — ABNORMAL HIGH (ref 80–100)
MONO ABS: 0.5 x10 3/mm (ref 0.2–0.9)
MONOS PCT: 5.6 %
Neutrophil #: 6.9 x10 3/mm — ABNORMAL HIGH (ref 1.4–6.5)
Neutrophil %: 78.4 %
Platelet: 232 x10 3/mm (ref 150–440)
RBC: 4.38 10*6/uL (ref 3.80–5.20)
RDW: 13.5 % (ref 11.5–14.5)
WBC: 8.8 x10 3/mm (ref 3.6–11.0)

## 2013-12-31 LAB — CREATININE, SERUM
CREATININE: 0.77 mg/dL (ref 0.60–1.30)
EGFR (Non-African Amer.): >60

## 2013-12-31 LAB — HEPATIC FUNCTION PANEL A (ARMC)
Albumin: 3.8 g/dL (ref 3.4–5.0)
Alkaline Phosphatase: 91 U/L
Bilirubin, Direct: 0.1 mg/dL (ref 0.00–0.20)
Bilirubin,Total: 0.7 mg/dL (ref 0.2–1.0)
SGOT(AST): 25 U/L (ref 15–37)
SGPT (ALT): 38 U/L
Total Protein: 7 g/dL (ref 6.4–8.2)

## 2014-01-14 ENCOUNTER — Ambulatory Visit: Payer: Self-pay | Admitting: Internal Medicine

## 2014-02-11 LAB — CBC CANCER CENTER
Basophil #: 0.1 x10 3/mm (ref 0.0–0.1)
Basophil %: 0.8 %
EOS PCT: 1.8 %
Eosinophil #: 0.1 x10 3/mm (ref 0.0–0.7)
HCT: 43.3 % (ref 35.0–47.0)
HGB: 14.8 g/dL (ref 12.0–16.0)
Lymphocyte #: 1.2 x10 3/mm (ref 1.0–3.6)
Lymphocyte %: 16.1 %
MCH: 36.8 pg — ABNORMAL HIGH (ref 26.0–34.0)
MCHC: 34 g/dL (ref 32.0–36.0)
MCV: 108 fL — ABNORMAL HIGH (ref 80–100)
MONO ABS: 0.5 x10 3/mm (ref 0.2–0.9)
MONOS PCT: 6.9 %
NEUTROS ABS: 5.7 x10 3/mm (ref 1.4–6.5)
Neutrophil %: 74.4 %
Platelet: 259 x10 3/mm (ref 150–440)
RBC: 4.01 10*6/uL (ref 3.80–5.20)
RDW: 13.5 % (ref 11.5–14.5)
WBC: 7.7 x10 3/mm (ref 3.6–11.0)

## 2014-02-14 ENCOUNTER — Ambulatory Visit: Payer: Self-pay | Admitting: Internal Medicine

## 2014-03-25 ENCOUNTER — Ambulatory Visit: Payer: Self-pay | Admitting: Internal Medicine

## 2014-03-25 LAB — HEPATIC FUNCTION PANEL A (ARMC)
Albumin: 3.7 g/dL (ref 3.4–5.0)
Alkaline Phosphatase: 84 U/L
Bilirubin, Direct: 0.1 mg/dL (ref 0.0–0.2)
Bilirubin,Total: 0.6 mg/dL (ref 0.2–1.0)
SGOT(AST): 22 U/L (ref 15–37)
SGPT (ALT): 35 U/L
Total Protein: 6.7 g/dL (ref 6.4–8.2)

## 2014-03-25 LAB — CREATININE, SERUM
CREATININE: 0.89 mg/dL (ref 0.60–1.30)
EGFR (Non-African Amer.): >60

## 2014-03-25 LAB — CBC CANCER CENTER
Basophil #: 0.1 x10 3/mm (ref 0.0–0.1)
Basophil %: 0.7 %
Eosinophil #: 0.1 x10 3/mm (ref 0.0–0.7)
Eosinophil %: 1.5 %
HCT: 43.7 % (ref 35.0–47.0)
HGB: 14.7 g/dL (ref 12.0–16.0)
Lymphocyte %: 17.6 %
Lymphs Abs: 1.4 x10 3/mm (ref 1.0–3.6)
MCH: 35.4 pg — ABNORMAL HIGH (ref 26.0–34.0)
MCHC: 33.6 g/dL (ref 32.0–36.0)
MCV: 105 fL — ABNORMAL HIGH (ref 80–100)
Monocyte #: 0.6 x10 3/mm (ref 0.2–0.9)
Monocyte %: 7.2 %
Neutrophil #: 5.6 x10 3/mm (ref 1.4–6.5)
Neutrophil %: 73 %
Platelet: 235 x10 3/mm (ref 150–440)
RBC: 4.15 x10 6/mm (ref 3.80–5.20)
RDW: 12.9 % (ref 11.5–14.5)
WBC: 7.7 x10 3/mm (ref 3.6–11.0)

## 2014-04-16 ENCOUNTER — Ambulatory Visit: Payer: Self-pay | Admitting: Internal Medicine

## 2014-05-06 LAB — CBC CANCER CENTER
BASOS PCT: 0.8 %
Basophil #: 0.1 x10 3/mm (ref 0.0–0.1)
EOS PCT: 1.5 %
Eosinophil #: 0.1 x10 3/mm (ref 0.0–0.7)
HCT: 44.2 % (ref 35.0–47.0)
HGB: 14.7 g/dL (ref 12.0–16.0)
LYMPHS PCT: 19.4 %
Lymphocyte #: 1.4 x10 3/mm (ref 1.0–3.6)
MCH: 35 pg — ABNORMAL HIGH (ref 26.0–34.0)
MCHC: 33.2 g/dL (ref 32.0–36.0)
MCV: 105 fL — ABNORMAL HIGH (ref 80–100)
Monocyte #: 0.5 x10 3/mm (ref 0.2–0.9)
Monocyte %: 7.1 %
NEUTROS PCT: 71.2 %
Neutrophil #: 5.3 x10 3/mm (ref 1.4–6.5)
Platelet: 300 x10 3/mm (ref 150–440)
RBC: 4.19 10*6/uL (ref 3.80–5.20)
RDW: 13.5 % (ref 11.5–14.5)
WBC: 7.4 x10 3/mm (ref 3.6–11.0)

## 2014-05-17 ENCOUNTER — Ambulatory Visit: Payer: Self-pay | Admitting: Internal Medicine

## 2014-06-17 ENCOUNTER — Ambulatory Visit
Admit: 2014-06-17 | Disposition: A | Discharge status: Home / Self Care | Payer: Self-pay | Attending: Internal Medicine | Admitting: Internal Medicine

## 2014-06-25 ENCOUNTER — Ambulatory Visit: Payer: Self-pay | Admitting: Internal Medicine

## 2014-07-16 ENCOUNTER — Ambulatory Visit
Admit: 2014-07-16 | Disposition: A | Discharge status: Home / Self Care | Payer: Self-pay | Attending: Internal Medicine | Admitting: Internal Medicine

## 2014-07-29 LAB — CBC CANCER CENTER
BASOS ABS: 0.1 x10 3/mm (ref 0.0–0.1)
Basophil %: 1 %
EOS PCT: 1.2 %
Eosinophil #: 0.1 x10 3/mm (ref 0.0–0.7)
HCT: 41.8 % (ref 35.0–47.0)
HGB: 14.5 g/dL (ref 12.0–16.0)
LYMPHS PCT: 21.5 %
Lymphocyte #: 1.3 x10 3/mm (ref 1.0–3.6)
MCH: 36.1 pg — ABNORMAL HIGH (ref 26.0–34.0)
MCHC: 34.7 g/dL (ref 32.0–36.0)
MCV: 104 fL — ABNORMAL HIGH (ref 80–100)
MONOS PCT: 7.4 %
Monocyte #: 0.5 x10 3/mm (ref 0.2–0.9)
NEUTROS PCT: 68.9 %
Neutrophil #: 4.3 x10 3/mm (ref 1.4–6.5)
Platelet: 212 x10 3/mm (ref 150–440)
RBC: 4.02 10*6/uL (ref 3.80–5.20)
RDW: 13.9 % (ref 11.5–14.5)
WBC: 6.2 x10 3/mm (ref 3.6–11.0)

## 2014-09-08 ENCOUNTER — Encounter: Payer: Self-pay | Admitting: *Deleted

## 2014-09-08 ENCOUNTER — Other Ambulatory Visit: Payer: Self-pay | Admitting: *Deleted

## 2014-09-08 DIAGNOSIS — D45 Polycythemia vera: Secondary | ICD-10-CM

## 2014-09-08 HISTORY — DX: Polycythemia vera: D45

## 2014-09-09 ENCOUNTER — Inpatient Hospital Stay: Payer: No Typology Code available for payment source

## 2014-09-09 ENCOUNTER — Inpatient Hospital Stay: Payer: No Typology Code available for payment source | Attending: Internal Medicine

## 2014-09-09 DIAGNOSIS — D45 Polycythemia vera: Secondary | ICD-10-CM | POA: Diagnosis present

## 2014-09-09 LAB — COMPREHENSIVE METABOLIC PANEL
ALBUMIN: 4.1 g/dL (ref 3.5–5.0)
ALT: 40 U/L (ref 14–54)
AST: 33 U/L (ref 15–41)
Alkaline Phosphatase: 86 U/L (ref 38–126)
Anion gap: 7 (ref 5–15)
BUN: 12 mg/dL (ref 6–20)
CO2: 29 mmol/L (ref 22–32)
Calcium: 9 mg/dL (ref 8.9–10.3)
Chloride: 105 mmol/L (ref 101–111)
Creatinine, Ser: 0.74 mg/dL (ref 0.44–1.00)
Glucose, Bld: 99 mg/dL (ref 65–99)
Potassium: 4.5 mmol/L (ref 3.5–5.1)
SODIUM: 141 mmol/L (ref 135–145)
TOTAL PROTEIN: 6.8 g/dL (ref 6.5–8.1)
Total Bilirubin: 0.7 mg/dL (ref 0.3–1.2)

## 2014-09-09 LAB — CBC WITH DIFFERENTIAL/PLATELET
Basophils Absolute: 0 10*3/uL (ref 0–0.1)
Basophils Relative: 1 %
Eosinophils Absolute: 0.1 10*3/uL (ref 0–0.7)
Eosinophils Relative: 1 %
HCT: 42.3 % (ref 35.0–47.0)
Hemoglobin: 14.7 g/dL (ref 12.0–16.0)
LYMPHS ABS: 1.3 10*3/uL (ref 1.0–3.6)
LYMPHS PCT: 20 %
MCH: 37.2 pg — AB (ref 26.0–34.0)
MCHC: 34.6 g/dL (ref 32.0–36.0)
MCV: 107.6 fL — ABNORMAL HIGH (ref 80.0–100.0)
MONOS PCT: 6 %
Monocytes Absolute: 0.4 10*3/uL (ref 0.2–0.9)
Neutro Abs: 4.5 10*3/uL (ref 1.4–6.5)
Neutrophils Relative %: 72 %
Platelets: 241 10*3/uL (ref 150–440)
RBC: 3.93 MIL/uL (ref 3.80–5.20)
RDW: 13.8 % (ref 11.5–14.5)
WBC: 6.3 10*3/uL (ref 3.6–11.0)

## 2014-10-21 ENCOUNTER — Inpatient Hospital Stay: Payer: No Typology Code available for payment source

## 2014-10-27 ENCOUNTER — Other Ambulatory Visit: Payer: Self-pay

## 2014-10-27 DIAGNOSIS — D751 Secondary polycythemia: Secondary | ICD-10-CM

## 2014-10-28 ENCOUNTER — Inpatient Hospital Stay: Payer: No Typology Code available for payment source

## 2014-10-28 ENCOUNTER — Inpatient Hospital Stay: Payer: No Typology Code available for payment source | Attending: Internal Medicine

## 2014-10-28 VITALS — BP 128/63 | HR 76 | Temp 97.6°F | Resp 20

## 2014-10-28 DIAGNOSIS — D751 Secondary polycythemia: Secondary | ICD-10-CM

## 2014-10-28 DIAGNOSIS — D45 Polycythemia vera: Secondary | ICD-10-CM

## 2014-10-28 LAB — CBC WITH DIFFERENTIAL/PLATELET
Basophils Absolute: 0 10*3/uL (ref 0–0.1)
Basophils Relative: 1 %
EOS ABS: 0.1 10*3/uL (ref 0–0.7)
EOS PCT: 2 %
HEMATOCRIT: 43.1 % (ref 35.0–47.0)
HEMOGLOBIN: 14.7 g/dL (ref 12.0–16.0)
LYMPHS ABS: 1.9 10*3/uL (ref 1.0–3.6)
Lymphocytes Relative: 26 %
MCH: 37.9 pg — ABNORMAL HIGH (ref 26.0–34.0)
MCHC: 34.2 g/dL (ref 32.0–36.0)
MCV: 110.8 fL — AB (ref 80.0–100.0)
MONO ABS: 0.5 10*3/uL (ref 0.2–0.9)
MONOS PCT: 7 %
Neutro Abs: 4.9 10*3/uL (ref 1.4–6.5)
Neutrophils Relative %: 64 %
Platelets: 223 10*3/uL (ref 150–440)
RBC: 3.89 MIL/uL (ref 3.80–5.20)
RDW: 12.7 % (ref 11.5–14.5)
WBC: 7.5 10*3/uL (ref 3.6–11.0)

## 2014-12-02 ENCOUNTER — Inpatient Hospital Stay: Payer: No Typology Code available for payment source | Attending: Internal Medicine

## 2014-12-02 ENCOUNTER — Inpatient Hospital Stay: Payer: No Typology Code available for payment source

## 2014-12-02 ENCOUNTER — Telehealth: Payer: Self-pay | Admitting: *Deleted

## 2014-12-02 ENCOUNTER — Other Ambulatory Visit: Payer: Self-pay | Admitting: *Deleted

## 2014-12-02 DIAGNOSIS — D45 Polycythemia vera: Secondary | ICD-10-CM

## 2014-12-02 DIAGNOSIS — D75839 Thrombocytosis, unspecified: Secondary | ICD-10-CM

## 2014-12-02 DIAGNOSIS — D473 Essential (hemorrhagic) thrombocythemia: Secondary | ICD-10-CM

## 2014-12-02 LAB — CBC WITH DIFFERENTIAL/PLATELET
Basophils Absolute: 0.1 K/uL (ref 0–0.1)
Basophils Relative: 1 %
Eosinophils Absolute: 0.1 K/uL (ref 0–0.7)
Eosinophils Relative: 1 %
HCT: 43.5 % (ref 35.0–47.0)
Hemoglobin: 15 g/dL (ref 12.0–16.0)
Lymphocytes Relative: 20 %
Lymphs Abs: 1.4 K/uL (ref 1.0–3.6)
MCH: 38.1 pg — ABNORMAL HIGH (ref 26.0–34.0)
MCHC: 34.5 g/dL (ref 32.0–36.0)
MCV: 110.4 fL — ABNORMAL HIGH (ref 80.0–100.0)
Monocytes Absolute: 0.4 K/uL (ref 0.2–0.9)
Monocytes Relative: 6 %
Neutro Abs: 5.2 K/uL (ref 1.4–6.5)
Neutrophils Relative %: 72 %
Platelets: 254 K/uL (ref 150–440)
RBC: 3.94 MIL/uL (ref 3.80–5.20)
RDW: 13.2 % (ref 11.5–14.5)
WBC: 7.2 K/uL (ref 3.6–11.0)

## 2014-12-02 LAB — CREATININE, SERUM: Creatinine, Ser: 0.75 mg/dL (ref 0.44–1.00)

## 2014-12-02 LAB — HEPATIC FUNCTION PANEL
ALT: 39 U/L (ref 14–54)
AST: 25 U/L (ref 15–41)
Albumin: 4.1 g/dL (ref 3.5–5.0)
Alkaline Phosphatase: 81 U/L (ref 38–126)
TOTAL PROTEIN: 6.7 g/dL (ref 6.5–8.1)
Total Bilirubin: 0.9 mg/dL (ref 0.3–1.2)

## 2014-12-02 MED ORDER — HYDROXYUREA 500 MG PO CAPS: ORAL_CAPSULE | ORAL | Status: DC

## 2014-12-02 NOTE — Telephone Encounter (Signed)
Pt here and needs refill called into [facility] in [place], asked pandit and he was ok with refill and it was called into her pharmacy

## 2015-01-13 ENCOUNTER — Inpatient Hospital Stay: Payer: No Typology Code available for payment source | Attending: Internal Medicine

## 2015-01-13 ENCOUNTER — Inpatient Hospital Stay: Payer: No Typology Code available for payment source

## 2015-01-13 DIAGNOSIS — D75839 Thrombocytosis, unspecified: Secondary | ICD-10-CM

## 2015-01-13 DIAGNOSIS — D45 Polycythemia vera: Secondary | ICD-10-CM | POA: Insufficient documentation

## 2015-01-13 DIAGNOSIS — D473 Essential (hemorrhagic) thrombocythemia: Secondary | ICD-10-CM

## 2015-01-13 LAB — CBC WITH DIFFERENTIAL/PLATELET
BASOS ABS: 0 10*3/uL (ref 0–0.1)
BASOS PCT: 1 %
EOS ABS: 0.1 10*3/uL (ref 0–0.7)
Eosinophils Relative: 1 %
HCT: 42 % (ref 35.0–47.0)
HEMOGLOBIN: 14.7 g/dL (ref 12.0–16.0)
Lymphocytes Relative: 25 %
Lymphs Abs: 1.5 10*3/uL (ref 1.0–3.6)
MCH: 38.3 pg — ABNORMAL HIGH (ref 26.0–34.0)
MCHC: 35 g/dL (ref 32.0–36.0)
MCV: 109.4 fL — ABNORMAL HIGH (ref 80.0–100.0)
Monocytes Absolute: 0.3 10*3/uL (ref 0.2–0.9)
Monocytes Relative: 5 %
NEUTROS ABS: 4.2 10*3/uL (ref 1.4–6.5)
NEUTROS PCT: 68 %
Platelets: 192 10*3/uL (ref 150–440)
RBC: 3.84 MIL/uL (ref 3.80–5.20)
RDW: 12.7 % (ref 11.5–14.5)
WBC: 6.1 10*3/uL (ref 3.6–11.0)

## 2015-02-24 ENCOUNTER — Inpatient Hospital Stay: Payer: No Typology Code available for payment source | Attending: Internal Medicine

## 2015-02-24 ENCOUNTER — Inpatient Hospital Stay: Payer: No Typology Code available for payment source | Admitting: Internal Medicine

## 2015-02-24 ENCOUNTER — Other Ambulatory Visit: Payer: Self-pay | Admitting: Family Medicine

## 2015-02-24 ENCOUNTER — Inpatient Hospital Stay: Payer: No Typology Code available for payment source

## 2015-02-24 VITALS — BP 133/57 | HR 78 | Temp 98.0°F | Resp 20 | Wt 212.1 lb

## 2015-02-24 DIAGNOSIS — Z87891 Personal history of nicotine dependence: Secondary | ICD-10-CM | POA: Diagnosis not present

## 2015-02-24 DIAGNOSIS — D45 Polycythemia vera: Secondary | ICD-10-CM | POA: Diagnosis present

## 2015-02-24 DIAGNOSIS — Z7982 Long term (current) use of aspirin: Secondary | ICD-10-CM | POA: Insufficient documentation

## 2015-02-24 DIAGNOSIS — Z79899 Other long term (current) drug therapy: Secondary | ICD-10-CM | POA: Insufficient documentation

## 2015-02-24 DIAGNOSIS — R161 Splenomegaly, not elsewhere classified: Secondary | ICD-10-CM | POA: Diagnosis not present

## 2015-02-24 DIAGNOSIS — D75839 Thrombocytosis, unspecified: Secondary | ICD-10-CM

## 2015-02-24 DIAGNOSIS — D473 Essential (hemorrhagic) thrombocythemia: Secondary | ICD-10-CM

## 2015-02-24 LAB — COMPREHENSIVE METABOLIC PANEL
ALBUMIN: 4.3 g/dL (ref 3.5–5.0)
ALT: 32 U/L (ref 14–54)
AST: 25 U/L (ref 15–41)
Alkaline Phosphatase: 81 U/L (ref 38–126)
Anion gap: 8 (ref 5–15)
BUN: 14 mg/dL (ref 6–20)
CHLORIDE: 103 mmol/L (ref 101–111)
CO2: 28 mmol/L (ref 22–32)
Calcium: 9.2 mg/dL (ref 8.9–10.3)
Creatinine, Ser: 0.7 mg/dL (ref 0.44–1.00)
GFR calc Af Amer: >60 mL/min (ref >60)
GLUCOSE: 91 mg/dL (ref 65–99)
POTASSIUM: 4.2 mmol/L (ref 3.5–5.1)
SODIUM: 139 mmol/L (ref 135–145)
Total Bilirubin: 0.6 mg/dL (ref 0.3–1.2)
Total Protein: 7.1 g/dL (ref 6.5–8.1)

## 2015-02-24 LAB — CBC WITH DIFFERENTIAL/PLATELET
BASOS ABS: 0 10*3/uL (ref 0–0.1)
BASOS PCT: 1 %
EOS ABS: 0.1 10*3/uL (ref 0–0.7)
EOS PCT: 1 %
HCT: 43.4 % (ref 35.0–47.0)
Hemoglobin: 15.1 g/dL (ref 12.0–16.0)
Lymphocytes Relative: 23 %
Lymphs Abs: 1.5 10*3/uL (ref 1.0–3.6)
MCH: 39 pg — ABNORMAL HIGH (ref 26.0–34.0)
MCHC: 34.9 g/dL (ref 32.0–36.0)
MCV: 111.7 fL — ABNORMAL HIGH (ref 80.0–100.0)
MONO ABS: 0.4 10*3/uL (ref 0.2–0.9)
Monocytes Relative: 7 %
Neutro Abs: 4.5 10*3/uL (ref 1.4–6.5)
Neutrophils Relative %: 68 %
PLATELETS: 208 10*3/uL (ref 150–440)
RBC: 3.89 MIL/uL (ref 3.80–5.20)
RDW: 13.6 % (ref 11.5–14.5)
WBC: 6.5 10*3/uL (ref 3.6–11.0)

## 2015-02-24 MED ORDER — HYDROXYUREA 500 MG PO CAPS: ORAL_CAPSULE | ORAL | Status: DC

## 2015-02-24 NOTE — Progress Notes (Signed)
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 The workup as outlined below: a. 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 b. 12/23/09: EPO level 2.5 extremely low, Lap score 77, Bcr-Abl -Carbon monoxide level 0.9 %, CLL profile: monoclonal b cell population, c. 12/27/09 : abd U/S: splenomegaly 20 cm. d. 12/29/09: bone marrow biopsy and aspiration: hypercellular marrow with trilineage hematopoiesis.  No malignancy or dysplasia identified in marrow specimen.  52 XY. Patient was started on phlebotomies, but hydroxyurea had to be initiated because of difficulties performing phlebotomies secondary to poor venous access.  CHIEF COMPLAINT:  Chief Complaint  Patient presents with  . polycythemia    INTERVAL HISTORY: Since her last appointment risks Lucero has felt well. She does not have particular complaints at this point. She is able to perform all activities of daily living with no significant difficulties. She continues to tolerate hydroxyurea very well without any evidence of gastrointestinal or skin toxicities.  REVIEW OF SYSTEMS:    As per HPI. Otherwise, a complete review of systems is negatve.  PAST MEDICAL HISTORY: Past Medical History  Diagnosis Date  . PV (polycythemia vera) ([place]) 09/08/2014    PAST SURGICAL HISTORY: Past Surgical History  Procedure Laterality Date  . Appendectomy    . Tonsillectomy      FAMILY HISTORY Family History  Problem Relation Age of Onset  . Cancer Mother 39    breast  . Cancer Sister     skin    ADVANCED DIRECTIVES:  No flowsheet data found.  HEALTH MAINTENANCE: Social History  Substance Use Topics  . Smoking status: Former Research scientist (life sciences)  . Smokeless  tobacco: Former Systems developer    Quit date: 02/23/2010  . Alcohol Use: 0.0 oz/week    0 Standard drinks or equivalent per week     Comment: occasional      Allergies not on file  Current Outpatient Prescriptions  Medication Sig Dispense Refill  . hydroxyurea (HYDREA) 500 MG capsule Take 2 capsules on Sun, Tues, Thurs, and Sat.   Take 1 capsule on Mon, wed, and Fri. 49 capsule 3   No current facility-administered medications for this visit.    OBJECTIVE:  Filed Vitals:   02/24/15 1000  BP: 133/57  Pulse: 78  Temp: 98 F (36.7 C)  Resp: 20     There is no height or weight on file to calculate BMI.    ECOG FS:0 - Asymptomatic  PHYSICAL EXAM: BP 133/57 mmHg  Pulse 78  Temp(Src) 98 F (36.7 C) (Tympanic)  Resp 20  Wt 212 lb 1.3 oz (96.2 kg)  General Appearance:    Alert, cooperative, no distress, appears stated age Caucasian female ,   Head:    Normocephalic, without obvious abnormality, atraumatic  Eyes:    PERRL, conjunctiva/corneas clear, EOM's intact, fundi    benign, both eyes  Ears:    Normal TM's and external ear canals, both ears  Nose:   Nares normal, septum midline, mucosa normal, no drainage    or sinus tenderness  Throat:   Lips, mucosa, and tongue normal; teeth and gums normal  Neck:   Supple, symmetrical, trachea midline, no adenopathy;    thyroid:  no enlargement/tenderness/nodules; no carotid   bruit or  JVD  Back:     Symmetric, no curvature, ROM normal, no CVA tenderness  Lungs:     Clear to auscultation bilaterally, respirations unlabored  Chest Wall:    No tenderness or deformity   Heart:    Regular rate and rhythm, S1 and S2 normal, no murmur, rub   or gallop  Breast Exam:    No tenderness, masses, or nipple abnormality  Abdomen:     Soft, non-tender, bowel sounds active all four quadrants,    no masses, no organomegaly  Genitalia:   deferred by patient  Rectal:    deferred by patient  Extremities:   Extremities normal, atraumatic, no cyanosis or edema    Pulses:   2+ and symmetric all extremities  Skin:   Skin color, texture, turgor normal, no rashes or lesions  Lymph nodes:   Cervical, supraclavicular, and axillary nodes normal  Neurologic:   CNII-XII intact, normal strength, sensation and reflexes    throughout      LAB RESULTS:  CBC Latest Ref Rng 02/24/2015 01/13/2015  WBC 3.6 - 11.0 K/uL 6.5 6.1  Hemoglobin 12.0 - 16.0 g/dL 15.1 14.7  Hematocrit 35.0 - 47.0 % 43.4 42.0  Platelets 150 - 440 K/uL 208 192    Appointment on 02/24/2015  Component Date Value Ref Range Status  . WBC 02/24/2015 6.5  3.6 - 11.0 K/uL Final  . RBC 02/24/2015 3.89  3.80 - 5.20 MIL/uL Final  . Hemoglobin 02/24/2015 15.1  12.0 - 16.0 g/dL Final  . HCT 02/24/2015 43.4  35.0 - 47.0 % Final  . MCV 02/24/2015 111.7* 80.0 - 100.0 fL Final  . MCH 02/24/2015 39.0* 26.0 - 34.0 pg Final  . MCHC 02/24/2015 34.9  32.0 - 36.0 g/dL Final  . RDW 02/24/2015 13.6  11.5 - 14.5 % Final  . Platelets 02/24/2015 208  150 - 440 K/uL Final  . Neutrophils Relative % 02/24/2015 68   Final  . Neutro Abs 02/24/2015 4.5  1.4 - 6.5 K/uL Final  . Lymphocytes Relative 02/24/2015 23   Final  . Lymphs Abs 02/24/2015 1.5  1.0 - 3.6 K/uL Final  . Monocytes Relative 02/24/2015 7   Final  . Monocytes Absolute 02/24/2015 0.4  0.2 - 0.9 K/uL Final  . Eosinophils Relative 02/24/2015 1   Final  . Eosinophils Absolute 02/24/2015 0.1  0 - 0.7 K/uL Final  . Basophils Relative 02/24/2015 1   Final  . Basophils Absolute 02/24/2015 0.0  0 - 0.1 K/uL Final  . Sodium 02/24/2015 139  135 - 145 mmol/L Final  . Potassium 02/24/2015 4.2  3.5 - 5.1 mmol/L Final  . Chloride 02/24/2015 103  101 - 111 mmol/L Final  . CO2 02/24/2015 28  22 - 32 mmol/L Final  . Glucose, Bld 02/24/2015 91  65 - 99 mg/dL Final  . BUN 02/24/2015 14  6 - 20 mg/dL Final  . Creatinine, Ser 02/24/2015 0.70  0.44 - 1.00 mg/dL Final  . Calcium 02/24/2015 9.2  8.9 - 10.3 mg/dL Final  . Total Protein 02/24/2015 7.1  6.5 - 8.1  g/dL Final  . Albumin 02/24/2015 4.3  3.5 - 5.0 g/dL Final  . AST 02/24/2015 25  15 - 41 U/L Final  . ALT 02/24/2015 32  14 - 54 U/L Final  . Alkaline Phosphatase 02/24/2015 81  38 - 126 U/L Final  . Total Bilirubin 02/24/2015 0.6  0.3 - 1.2 mg/dL Final  . GFR calc non Af Amer 02/24/2015 >60  >60  mL/min Final  . GFR calc Af Amer 02/24/2015 >60  >60 mL/min Final   Comment: (NOTE) The eGFR has been calculated using the CKD EPI equation. This calculation has not been validated in all clinical situations. eGFR's persistently <60 mL/min signify possible Chronic Kidney Disease.   . Anion gap 02/24/2015 8  5 - 15 Final      STUDIES: No results found.  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 Oxendine regarding the natural history and treatment options for polycythemia vera. We underlying the fact that polycythemia vera is a type of hematologic malignancy, we have a very long natural history, and major problems arising from the increased risk of arterial and venous blood clots. This disease  tends to run a lengthy course, and eventually can transform into myelofibrosis in about 10% of patients and into acute myelogenous leukemia in about 3% of patients. Such transformation was usually occur after 10-20 years, but taking into account the fact that Mrs. Maultsby is very young, this potentially could be a problem she will have to face in the future. As of now her disease falls into the low risk category, so the most appropriate treatment for her is low-dose aspirin and phlebotomies. However, it appears that phlebotomies aren't technically difficult in her case, so Mrs. Weseman prefers to stay on hydroxyurea in order to limit the number of necessary phlebotomies. She should continue with the current dosage for the next 3 months, with the goal being to keep hematocrit below 45%, which is the only established threshold, above which patients  with polycythemia vera half an increased risk of cardiovascular events and venous thrombosis. She will continue with low-dose aspirin.  Age-appropriate screening-patient is up-to-date with mammograms (done in February 2016) and colonoscopy (done in 2015)   Patient expressed understanding and was in agreement with this plan. She also understands that She can call clinic at any time with any questions, concerns, or complaints.    Orders Placed This Encounter  Procedures  . CBC with Differential/Platelet    Standing Status: Future     Number of Occurrences:      Standing Expiration Date: 02/24/2016     Roxana Hires, MD   02/24/2015 10:19 AM

## 2015-06-02 ENCOUNTER — Inpatient Hospital Stay: Payer: 59

## 2015-06-02 ENCOUNTER — Inpatient Hospital Stay: Payer: 59 | Attending: Internal Medicine

## 2015-06-02 ENCOUNTER — Inpatient Hospital Stay (HOSPITAL_BASED_OUTPATIENT_CLINIC_OR_DEPARTMENT_OTHER): Payer: 59 | Admitting: Internal Medicine

## 2015-06-02 ENCOUNTER — Encounter: Payer: Self-pay | Admitting: Internal Medicine

## 2015-06-02 VITALS — BP 115/77 | HR 82 | Temp 98.2°F | Resp 18 | Ht 65.0 in | Wt 210.5 lb

## 2015-06-02 DIAGNOSIS — Z7982 Long term (current) use of aspirin: Secondary | ICD-10-CM | POA: Insufficient documentation

## 2015-06-02 DIAGNOSIS — D45 Polycythemia vera: Secondary | ICD-10-CM | POA: Insufficient documentation

## 2015-06-02 DIAGNOSIS — Z87891 Personal history of nicotine dependence: Secondary | ICD-10-CM | POA: Insufficient documentation

## 2015-06-02 DIAGNOSIS — Z79899 Other long term (current) drug therapy: Secondary | ICD-10-CM

## 2015-06-02 DIAGNOSIS — Z803 Family history of malignant neoplasm of breast: Secondary | ICD-10-CM | POA: Diagnosis not present

## 2015-06-02 LAB — CBC WITH DIFFERENTIAL/PLATELET
BASOS ABS: 0 10*3/uL (ref 0–0.1)
Basophils Relative: 1 %
EOS ABS: 0.1 10*3/uL (ref 0–0.7)
Eosinophils Relative: 1 %
HCT: 44.7 % (ref 35.0–47.0)
Hemoglobin: 15.5 g/dL (ref 12.0–16.0)
Lymphocytes Relative: 19 %
Lymphs Abs: 1.5 10*3/uL (ref 1.0–3.6)
MCH: 39.2 pg — AB (ref 26.0–34.0)
MCHC: 34.6 g/dL (ref 32.0–36.0)
MCV: 113.1 fL — ABNORMAL HIGH (ref 80.0–100.0)
MONO ABS: 0.5 10*3/uL (ref 0.2–0.9)
Monocytes Relative: 7 %
Neutro Abs: 5.8 10*3/uL (ref 1.4–6.5)
Neutrophils Relative %: 74 %
PLATELETS: 189 10*3/uL (ref 150–440)
RBC: 3.96 MIL/uL (ref 3.80–5.20)
RDW: 12.7 % (ref 11.5–14.5)
WBC: 7.9 10*3/uL (ref 3.6–11.0)

## 2015-06-02 NOTE — Progress Notes (Signed)
[place] @ [facility] Telephone:(336) [phone]  Fax:(336) [phone]     Melissa Patrick OB: 1960-12-05  MR#: [id number]  AYT#:[id number]  No care team member to display  Diagnosis: Low- risk [id number]- positive Polycythemia Vera   HPI: Initially presented with elevated hemoglobin and white blood cell count in 2011. The workup as outlined below: a. 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 b. 12/23/09: EPO level 2.5 extremely low, Lap score 77, Bcr-Abl -Carbon monoxide level 0.9 %, CLL profile: monoclonal b cell population, c. 12/27/09 : abd U/S: splenomegaly 20 cm. d. 12/29/09: bone marrow biopsy and aspiration: hypercellular marrow with trilineage hematopoiesis.  No malignancy or dysplasia identified in marrow specimen.  67 XY. Patient was started on phlebotomies, but hydroxyurea had to be initiated because of difficulties performing phlebotomies secondary to poor venous access.  CHIEF COMPLAINT:  No chief complaint on file.   INTERVAL HISTORY: Since her last appointment Ms. Travaglini has felt well without particular complaints or health problems. She is able to perform all activities of daily living with no significant difficulties. She continues to tolerate hydroxyurea very well without any evidence of gastrointestinal or skin toxicities.  REVIEW OF SYSTEMS:    As per HPI. Otherwise, a complete review of systems is negatve.  PAST MEDICAL HISTORY: Past Medical History  Diagnosis Date  . PV (polycythemia vera) ([place]) 09/08/2014    PAST SURGICAL HISTORY: Past Surgical History  Procedure Laterality Date  . Appendectomy    . Tonsillectomy      FAMILY HISTORY Family History  Problem Relation Age of Onset  . Cancer Mother 45    breast  . Cancer Sister     skin    ADVANCED DIRECTIVES:  No flowsheet data found.  HEALTH MAINTENANCE: Social History  Substance Use Topics  . Smoking status: Former Research scientist (life sciences)  . Smokeless tobacco: Former Systems developer    Quit date:  02/23/2010  . Alcohol Use: 0.0 oz/week    0 Standard drinks or equivalent per week     Comment: occasional      Allergies  Allergen Reactions  . Penicillin G Hives    Current Outpatient Prescriptions  Medication Sig Dispense Refill  . aspirin 81 MG tablet Take 81 mg by mouth daily.    . hydroxyurea (HYDREA) 500 MG capsule Take 2 capsules on Sun, Tues, Thurs, and Sat.   Take 1 capsule on Mon, wed, and Fri. 49 capsule 3   No current facility-administered medications for this visit.    OBJECTIVE:  There were no vitals filed for this visit.   There is no height or weight on file to calculate BMI.    ECOG FS:0 - Asymptomatic  PHYSICAL EXAM: There were no vitals taken for this visit.  General Appearance:    Alert, cooperative, no distress, appears stated age Caucasian female ,   Head:    Normocephalic, without obvious abnormality, atraumatic  Eyes:    PERRL, conjunctiva/corneas clear, EOM's intact, fundi    benign, both eyes  Ears:    Normal TM's and external ear canals, both ears  Nose:   Nares normal, septum midline, mucosa normal, no drainage    or sinus tenderness  Throat:   Lips, mucosa, and tongue normal; teeth and gums normal  Neck:   Supple, symmetrical, trachea midline, no adenopathy;    thyroid:  no enlargement/tenderness/nodules; no carotid   bruit or JVD  Back:     Symmetric, no curvature, ROM normal, no CVA tenderness  Lungs:  Clear to auscultation bilaterally, respirations unlabored  Chest Wall:    No tenderness or deformity   Heart:    Regular rate and rhythm, S1 and S2 normal, no murmur, rub   or gallop  Breast Exam:    No tenderness, masses, or nipple abnormality  Abdomen:     Soft, non-tender, bowel sounds active all four quadrants,    no masses, no organomegaly  Genitalia:   deferred by patient  Rectal:    deferred by patient  Extremities:   Extremities normal, atraumatic, no cyanosis or edema  Pulses:   2+ and symmetric all extremities  Skin:   Skin  color, texture, turgor normal, no rashes or lesions  Lymph nodes:   Cervical, supraclavicular, and axillary nodes normal  Neurologic:   CNII-XII intact, normal strength, sensation and reflexes    throughout      LAB RESULTS:  CBC Latest Ref Rng 02/24/2015 01/13/2015  WBC 3.6 - 11.0 K/uL 6.5 6.1  Hemoglobin 12.0 - 16.0 g/dL 15.1 14.7  Hematocrit 35.0 - 47.0 % 43.4 42.0  Platelets 150 - 440 K/uL 208 192    Appointment on 06/02/2015  Component Date Value Ref Range Status  . WBC 06/02/2015 7.9  3.6 - 11.0 K/uL Final  . RBC 06/02/2015 3.96  3.80 - 5.20 MIL/uL Final  . Hemoglobin 06/02/2015 15.5  12.0 - 16.0 g/dL Final  . HCT 06/02/2015 44.7  35.0 - 47.0 % Final  . MCV 06/02/2015 113.1* 80.0 - 100.0 fL Final  . MCH 06/02/2015 39.2* 26.0 - 34.0 pg Final  . MCHC 06/02/2015 34.6  32.0 - 36.0 g/dL Final  . RDW 06/02/2015 12.7  11.5 - 14.5 % Final  . Platelets 06/02/2015 189  150 - 440 K/uL Final  . Neutrophils Relative % 06/02/2015 74   Final  . Neutro Abs 06/02/2015 5.8  1.4 - 6.5 K/uL Final  . Lymphocytes Relative 06/02/2015 19   Final  . Lymphs Abs 06/02/2015 1.5  1.0 - 3.6 K/uL Final  . Monocytes Relative 06/02/2015 7   Final  . Monocytes Absolute 06/02/2015 0.5  0.2 - 0.9 K/uL Final  . Eosinophils Relative 06/02/2015 1   Final  . Eosinophils Absolute 06/02/2015 0.1  0 - 0.7 K/uL Final  . Basophils Relative 06/02/2015 1   Final  . Basophils Absolute 06/02/2015 0.0  0 - 0.1 K/uL Final      STUDIES: No results found.  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 However, phlebotomies are technically difficult in her case, so we will continue hydroxyurea. Her Hct has increased somewhat, so we should increase the dose of HU to 1000 mg PO daily Mon- Fri, and 500 mg PO daily on Sat and Sun. The goal remains to keep hematocrit below 45%, which is the only established threshold, above  which patients with polycythemia vera have an increased risk of cardiovascular events and venous thrombosis. She will continue with low-dose aspirin. There is no clinical or lab evidence of POD to MF, MDS or AML. Age-appropriate screening-patient is up-to-date with mammograms (done in February 2016) and colonoscopy (done in 2015)  RTC in 3 months.  Patient expressed understanding and was in agreement with this plan. She also understands that She can call clinic at any time with any questions, concerns, or complaints.    No orders of the defined types were placed in this encounter.     Roxana Hires, MD  06/02/2015 9:28 AM

## 2015-06-02 NOTE — Progress Notes (Signed)
Pt doing great, no c/o. She is eating the same, bowels ok. Sometimes notices her toes slight discoloration after she gets out of shower but then comes back to normal color after a few min. Out of shower.

## 2015-08-24 ENCOUNTER — Telehealth: Payer: Self-pay | Admitting: *Deleted

## 2015-08-24 ENCOUNTER — Inpatient Hospital Stay (HOSPITAL_BASED_OUTPATIENT_CLINIC_OR_DEPARTMENT_OTHER): Payer: 59 | Admitting: Family Medicine

## 2015-08-24 ENCOUNTER — Inpatient Hospital Stay: Payer: 59 | Attending: Family Medicine

## 2015-08-24 VITALS — BP 126/78 | HR 71 | Temp 97.1°F | Resp 20 | Wt 202.6 lb

## 2015-08-24 DIAGNOSIS — Z7982 Long term (current) use of aspirin: Secondary | ICD-10-CM | POA: Diagnosis not present

## 2015-08-24 DIAGNOSIS — Z87891 Personal history of nicotine dependence: Secondary | ICD-10-CM

## 2015-08-24 DIAGNOSIS — R233 Spontaneous ecchymoses: Secondary | ICD-10-CM | POA: Insufficient documentation

## 2015-08-24 DIAGNOSIS — Z79899 Other long term (current) drug therapy: Secondary | ICD-10-CM | POA: Diagnosis not present

## 2015-08-24 DIAGNOSIS — D45 Polycythemia vera: Secondary | ICD-10-CM

## 2015-08-24 LAB — COMPREHENSIVE METABOLIC PANEL
ALBUMIN: 4.5 g/dL (ref 3.5–5.0)
ALK PHOS: 93 U/L (ref 38–126)
ALT: 33 U/L (ref 14–54)
AST: 26 U/L (ref 15–41)
Anion gap: 5 (ref 5–15)
BUN: 14 mg/dL (ref 6–20)
CALCIUM: 9.1 mg/dL (ref 8.9–10.3)
CO2: 29 mmol/L (ref 22–32)
CREATININE: 0.69 mg/dL (ref 0.44–1.00)
Chloride: 106 mmol/L (ref 101–111)
GFR calc Af Amer: >60 mL/min (ref >60)
GFR calc non Af Amer: >60 mL/min (ref >60)
GLUCOSE: 89 mg/dL (ref 65–99)
Potassium: 4.4 mmol/L (ref 3.5–5.1)
Sodium: 140 mmol/L (ref 135–145)
Total Bilirubin: 0.5 mg/dL (ref 0.3–1.2)
Total Protein: 6.9 g/dL (ref 6.5–8.1)

## 2015-08-24 LAB — CBC WITH DIFFERENTIAL/PLATELET
BASOS PCT: 0 %
Basophils Absolute: 0 10*3/uL (ref 0–0.1)
EOS ABS: 0.1 10*3/uL (ref 0–0.7)
Eosinophils Relative: 2 %
HCT: 42.8 % (ref 35.0–47.0)
HEMOGLOBIN: 15.3 g/dL (ref 12.0–16.0)
Lymphocytes Relative: 31 %
Lymphs Abs: 1.9 10*3/uL (ref 1.0–3.6)
MCH: 41.5 pg — ABNORMAL HIGH (ref 26.0–34.0)
MCHC: 35.6 g/dL (ref 32.0–36.0)
MCV: 116.4 fL — ABNORMAL HIGH (ref 80.0–100.0)
Monocytes Absolute: 0.5 10*3/uL (ref 0.2–0.9)
Monocytes Relative: 9 %
NEUTROS PCT: 58 %
Neutro Abs: 3.6 10*3/uL (ref 1.4–6.5)
Platelets: 177 10*3/uL (ref 150–440)
RBC: 3.68 MIL/uL — AB (ref 3.80–5.20)
RDW: 13.2 % (ref 11.5–14.5)
WBC: 6.1 10*3/uL (ref 3.6–11.0)

## 2015-08-24 LAB — PROTIME-INR
INR: 0.81
Prothrombin Time: 11.4 seconds (ref 11.4–15.0)

## 2015-08-24 LAB — APTT: APTT: 29 s (ref 24–36)

## 2015-08-24 NOTE — Telephone Encounter (Signed)
Pt sent picture.

## 2015-08-24 NOTE — Progress Notes (Signed)
Patient here today as acute add on due to new onset of bruising to left inner thigh. Patient reports bruise appeared this morning, denies any other symptoms of bleeding.

## 2015-08-24 NOTE — Telephone Encounter (Signed)
Called to report that she awoke this morning with a spontaneous  Bruise on her inner thigh larger than a golf ball was tingling this morning, but has stopped. She did not injure herself. She was a Dr Melissa Patrick patient who has Poly Vera and [id number] positive. She has a fu with Dr B on 5/16

## 2015-08-24 NOTE — Progress Notes (Signed)
[place]  Telephone:(336) [phone]  Fax:(336) [phone]     Melissa Patrick DOB: 1960/05/27  MR#: [id number]  SEG#:[id number]  Patient Care Team: Kirk Ruths, MD as PCP - General (Internal Medicine)  CHIEF COMPLAINT:  Chief Complaint  Patient presents with  . Polycythemia Vera    Follow up    INTERVAL HISTORY: Patient is here as an add on regarding an isolated bruise that developed overnight on her inner left thigh. She reports being unaware of any injury. She awoke this morning and her husband pointed out that she had a large 3 cm circular bruise on the inside of her left thigh. She continues to take 1 g of hydroxyurea 5 days per week and 500 mg of hydroxyurea on the weekends. She also continues with an aspirin daily. She otherwise has no complaints and denies any other acute issues.  REVIEW OF SYSTEMS:   Review of Systems  Constitutional: Negative for fever, chills, weight loss, malaise/fatigue and diaphoresis.  HENT: Negative.   Eyes: Negative.   Respiratory: Negative for cough, hemoptysis, sputum production, shortness of breath and wheezing.   Cardiovascular: Negative for chest pain, palpitations, orthopnea, claudication, leg swelling and PND.  Gastrointestinal: Negative for heartburn, nausea, vomiting, abdominal pain, diarrhea, constipation, blood in stool and melena.  Genitourinary: Negative.   Musculoskeletal: Negative.   Skin: Negative.   Neurological: Negative for dizziness, tingling, focal weakness, seizures and weakness.  Endo/Heme/Allergies: Bruises/bleeds easily.       1 isolated bruise on left inner thigh, developed overnight  Psychiatric/Behavioral: Negative for depression. The patient is not nervous/anxious and does not have insomnia.    History of present illness: As per HPI. Otherwise, a complete review of systems is negatve. Initially presented with elevated hemoglobin and white blood cell count in 2011. The workup as outlined  below: a. 12/23/09: POSITIVE for the detection of the V617F mutation. b. 12/23/09: EPO level 2.5 extremely low, Lap score 77, Bcr-Abl -Carbon monoxide level 0.9 %, CLL profile: monoclonal b cell population, c. 12/27/09 : abd U/S: splenomegaly 20 cm. d. 12/29/09: bone marrow biopsy and aspiration: hypercellular marrow with trilineage hematopoiesis. No malignancy or dysplasia identified in marrow specimen. 33 XY. Patient was started on phlebotomies, but hydroxyurea had to be initiated because of difficulties performing phlebotomies secondary to poor venous access.  PAST MEDICAL HISTORY: Past Medical History  Diagnosis Date  . PV (polycythemia vera) ([place]) 09/08/2014    PAST SURGICAL HISTORY: Past Surgical History  Procedure Laterality Date  . Appendectomy    . Tonsillectomy      FAMILY HISTORY Family History  Problem Relation Age of Onset  . Cancer Mother 11    breast  . Arthritis Mother   . Cancer Sister     skin  . Stroke Maternal Uncle   . Stroke Maternal Grandmother     GYNECOLOGIC HISTORY:  No LMP recorded.     ADVANCED DIRECTIVES:    HEALTH MAINTENANCE: Social History  Substance Use Topics  . Smoking status: Former Smoker    Quit date: 02/23/2010  . Smokeless tobacco: Never Used  . Alcohol Use: 0.0 oz/week    0 Standard drinks or equivalent per week     Comment: occasional about once a year at most      Allergies  Allergen Reactions  . Penicillin G Hives    Current Outpatient Prescriptions  Medication Sig Dispense Refill  . aspirin 81 MG tablet Take 81 mg by mouth daily.    . hydroxyurea (  HYDREA) 500 MG capsule Take 2 capsules on Sun, Tues, Thurs, and Sat.   Take 1 capsule on Mon, wed, and Fri. 49 capsule 3   No current facility-administered medications for this visit.    OBJECTIVE: BP 126/78 mmHg  Pulse 71  Temp(Src) 97.1 F (36.2 C) (Tympanic)  Resp 20  Wt 202 lb 9.6 oz (91.9 kg)   Body mass index is 33.71 kg/(m^2).    ECOG FS:0 -  Asymptomatic  General: Well-developed, well-nourished, no acute distress. Lungs: Clear to auscultation bilaterally. Heart: Regular rate and rhythm. No rubs, murmurs, or gallops. Musculoskeletal: No edema, cyanosis, or clubbing. Neuro: Alert, answering all questions appropriately. Cranial nerves grossly intact. Skin: 3 cm circular bruise left inner aspect thigh. No other petechiae or bruising noted. Psych: Normal affect.    LAB RESULTS:  Appointment on 08/24/2015  Component Date Value Ref Range Status  . WBC 08/24/2015 6.1  3.6 - 11.0 K/uL Final  . RBC 08/24/2015 3.68* 3.80 - 5.20 MIL/uL Final  . Hemoglobin 08/24/2015 15.3  12.0 - 16.0 g/dL Final  . HCT 08/24/2015 42.8  35.0 - 47.0 % Final  . MCV 08/24/2015 116.4* 80.0 - 100.0 fL Final  . MCH 08/24/2015 41.5* 26.0 - 34.0 pg Final  . MCHC 08/24/2015 35.6  32.0 - 36.0 g/dL Final  . RDW 08/24/2015 13.2  11.5 - 14.5 % Final  . Platelets 08/24/2015 177  150 - 440 K/uL Final  . Neutrophils Relative % 08/24/2015 58   Final  . Neutro Abs 08/24/2015 3.6  1.4 - 6.5 K/uL Final  . Lymphocytes Relative 08/24/2015 31   Final  . Lymphs Abs 08/24/2015 1.9  1.0 - 3.6 K/uL Final  . Monocytes Relative 08/24/2015 9   Final  . Monocytes Absolute 08/24/2015 0.5  0.2 - 0.9 K/uL Final  . Eosinophils Relative 08/24/2015 2   Final  . Eosinophils Absolute 08/24/2015 0.1  0 - 0.7 K/uL Final  . Basophils Relative 08/24/2015 0   Final  . Basophils Absolute 08/24/2015 0.0  0 - 0.1 K/uL Final  . Sodium 08/24/2015 140  135 - 145 mmol/L Final  . Potassium 08/24/2015 4.4  3.5 - 5.1 mmol/L Final  . Chloride 08/24/2015 106  101 - 111 mmol/L Final  . CO2 08/24/2015 29  22 - 32 mmol/L Final  . Glucose, Bld 08/24/2015 89  65 - 99 mg/dL Final  . BUN 08/24/2015 14  6 - 20 mg/dL Final  . Creatinine, Ser 08/24/2015 0.69  0.44 - 1.00 mg/dL Final  . Calcium 08/24/2015 9.1  8.9 - 10.3 mg/dL Final  . Total Protein 08/24/2015 6.9  6.5 - 8.1 g/dL Final  . Albumin  08/24/2015 4.5  3.5 - 5.0 g/dL Final  . AST 08/24/2015 26  15 - 41 U/L Final  . ALT 08/24/2015 33  14 - 54 U/L Final  . Alkaline Phosphatase 08/24/2015 93  38 - 126 U/L Final  . Total Bilirubin 08/24/2015 0.5  0.3 - 1.2 mg/dL Final  . GFR calc non Af Amer 08/24/2015 >60  >60 mL/min Final  . GFR calc Af Amer 08/24/2015 >60  >60 mL/min Final   Comment: (NOTE) The eGFR has been calculated using the CKD EPI equation. This calculation has not been validated in all clinical situations. eGFR's persistently <60 mL/min signify possible Chronic Kidney Disease.   . Anion gap 08/24/2015 5  5 - 15 Final  . Prothrombin Time 08/24/2015 11.4  11.4 - 15.0 seconds Final  . INR 08/24/2015 0.81  Final  . aPTT 08/24/2015 29  24 - 36 seconds Final    STUDIES: No results found.  ASSESSMENT:  Polycythemia vera.  PLAN:   1. PV. Jak [id number] positive. Patient was previously seen by Dr. Rudean Hitt. He has noted that the most appropriate treatment for this patient as low-dose aspirin and phlebotomies, however, phlebotomies a technically difficult. She has been on hydroxyurea for several years now. It is recommended that she continue with hydroxyurea 1 g daily Monday through Friday and 500 mg daily on Saturday and Sunday as well as a low-dose aspirin daily. The goal for her remains to continue with a hematocrit below 45%. Physical exam reveals only an isolated bruise and her INR is 0.81, platelets 177, hemoglobin 15.3 and hematocrit 42.8. Advised patient to apply some ice to area. Patient was scheduled to have follow-up appointment next week however this can be canceled as she was seen today. We'll have her return in approximately 4 months for continued evaluation.  Patient expressed understanding and was in agreement with this plan. She also understands that She can call clinic at any time with any questions, concerns, or complaints.   Dr. Rogue Bussing was available for consultation and review of plan of care for  this patient.   Evlyn Kanner, NP   08/24/2015 3:57 PM

## 2015-08-30 ENCOUNTER — Other Ambulatory Visit: Payer: 59

## 2015-08-30 ENCOUNTER — Ambulatory Visit: Payer: 59 | Admitting: Internal Medicine

## 2015-09-01 ENCOUNTER — Ambulatory Visit: Payer: 59

## 2015-09-01 ENCOUNTER — Other Ambulatory Visit: Payer: 59

## 2015-09-05 ENCOUNTER — Other Ambulatory Visit: Payer: Self-pay | Admitting: Family Medicine

## 2015-09-05 DIAGNOSIS — D45 Polycythemia vera: Secondary | ICD-10-CM

## 2015-09-05 MED ORDER — HYDROXYUREA 500 MG PO CAPS: ORAL_CAPSULE | ORAL | Status: DC

## 2015-12-23 ENCOUNTER — Other Ambulatory Visit: Payer: Self-pay

## 2015-12-23 DIAGNOSIS — D45 Polycythemia vera: Secondary | ICD-10-CM

## 2015-12-27 ENCOUNTER — Other Ambulatory Visit: Payer: 59

## 2015-12-27 ENCOUNTER — Ambulatory Visit: Payer: 59 | Admitting: Internal Medicine

## 2016-01-03 ENCOUNTER — Inpatient Hospital Stay: Payer: 59 | Attending: Internal Medicine | Admitting: Internal Medicine

## 2016-01-03 ENCOUNTER — Inpatient Hospital Stay: Payer: 59

## 2016-01-03 VITALS — BP 130/80 | HR 69 | Temp 96.8°F | Resp 20 | Ht 65.0 in | Wt 217.4 lb

## 2016-01-03 VITALS — BP 115/78 | HR 70 | Resp 20

## 2016-01-03 DIAGNOSIS — Z7982 Long term (current) use of aspirin: Secondary | ICD-10-CM | POA: Diagnosis not present

## 2016-01-03 DIAGNOSIS — Z803 Family history of malignant neoplasm of breast: Secondary | ICD-10-CM | POA: Diagnosis not present

## 2016-01-03 DIAGNOSIS — D45 Polycythemia vera: Secondary | ICD-10-CM

## 2016-01-03 DIAGNOSIS — Z808 Family history of malignant neoplasm of other organs or systems: Secondary | ICD-10-CM | POA: Diagnosis not present

## 2016-01-03 DIAGNOSIS — Z79899 Other long term (current) drug therapy: Secondary | ICD-10-CM | POA: Diagnosis not present

## 2016-01-03 DIAGNOSIS — Z87891 Personal history of nicotine dependence: Secondary | ICD-10-CM

## 2016-01-03 LAB — CBC WITH DIFFERENTIAL/PLATELET
BASOS PCT: 1 %
Basophils Absolute: 0 10*3/uL (ref 0–0.1)
EOS ABS: 0.1 10*3/uL (ref 0–0.7)
EOS PCT: 1 %
HCT: 43.3 % (ref 35.0–47.0)
HEMOGLOBIN: 15 g/dL (ref 12.0–16.0)
LYMPHS ABS: 1.8 10*3/uL (ref 1.0–3.6)
Lymphocytes Relative: 33 %
MCH: 40.2 pg — AB (ref 26.0–34.0)
MCHC: 34.7 g/dL (ref 32.0–36.0)
MCV: 115.8 fL — ABNORMAL HIGH (ref 80.0–100.0)
Monocytes Absolute: 0.4 10*3/uL (ref 0.2–0.9)
Monocytes Relative: 7 %
NEUTROS PCT: 59 %
Neutro Abs: 3.3 10*3/uL (ref 1.4–6.5)
PLATELETS: 173 10*3/uL (ref 150–440)
RBC: 3.74 MIL/uL — AB (ref 3.80–5.20)
RDW: 12.5 % (ref 11.5–14.5)
WBC: 5.6 10*3/uL (ref 3.6–11.0)

## 2016-01-03 NOTE — Assessment & Plan Note (Addendum)
#   Polycythemia vera; today hematocrit is 43.3. Patient is on Hydrea and aspirin. No concerns for transformation. Check H&H every 3 months; possible phlebotomy.  # Risk of secondary malignancies was discussed- colonoscopy 2015- Normal;next 10 years; mammo-due this year.   # Follow-up H&H 3 months; possible phlebotomy follow-up with me in 6 months

## 2016-01-03 NOTE — Progress Notes (Signed)
[place] OFFICE PROGRESS NOTE  Patient Care Team: Kirk Ruths, MD as PCP - General (Internal Medicine)  No matching staging information was found for the patient.    No history exists.   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 12/23/09: EPO level 2.5 extremely low, Lap score 77, Bcr-Abl -Carbon monoxide level 0.9 %, CLL profile: monoclonal b cell population, c. 12/27/09 : abd U/S: splenomegaly 20 cm.; . 12/29/09: bone marrow biopsy and aspiration: hypercellular marrow with trilineage hematopoiesis.  No malignancy or dysplasia identified in marrow specimen.  80 XY. Patient was started on phlebotomies, but hydroxyurea had to be initiated because of difficulties performing phlebotomies secondary to poor venous access. M-F- 500 BID; sat-Sun 500/day.   INTERVAL HISTORY:  Melissa Patrick 55 y.o.  female pleasant patient above history of Polycythemia vera currently on aspirin and Hydrea; requiring intermittent phlebotomies is here for follow-up  Patient denies any rash and palms and soles. Denies any weight loss. Denies any sores in the mouth. Denies any tingling or numbness. No strokes or thrombo-embolic events.  REVIEW OF SYSTEMS:  A complete 10 point review of system is done which is negative except mentioned above/history of present illness.   PAST MEDICAL HISTORY :  Past Medical History:  Diagnosis Date  . PV (polycythemia vera) ([place]) 09/08/2014    PAST SURGICAL HISTORY :   Past Surgical History:  Procedure Laterality Date  . APPENDECTOMY    . TONSILLECTOMY      FAMILY HISTORY :   Family History  Problem Relation Age of Onset  . Cancer Mother 23    breast  . Arthritis Mother   . Cancer Sister     skin  . Stroke Maternal Uncle   . Stroke Maternal Grandmother     SOCIAL HISTORY:   Social History  Substance Use Topics  . Smoking status: Former Smoker    Quit date: 02/23/2010  .  Smokeless tobacco: Never Used  . Alcohol use 0.0 oz/week     Comment: occasional about once a year at most    ALLERGIES:  is allergic to penicillin g.  MEDICATIONS:  Current Outpatient Prescriptions  Medication Sig Dispense Refill  . aspirin 81 MG tablet Take 81 mg by mouth daily.    . hydroxyurea (HYDREA) 500 MG capsule Take 2 capsules on Monday- Friday. 1 cap Sunday and Saturday 60 capsule 4   No current facility-administered medications for this visit.     PHYSICAL EXAMINATION: ECOG PERFORMANCE STATUS: 0 - Asymptomatic  BP 130/80 (Patient Position: Sitting)   Pulse 69   Temp (!) 96.8 F (36 C) (Tympanic)   Resp 20   Ht 5' 5"  (1.651 m)   Wt 217 lb 6 oz (98.6 kg)   BMI 36.17 kg/m   Filed Weights   01/03/16 1203  Weight: 217 lb 6 oz (98.6 kg)    GENERAL: Well-nourished well-developed; Alert, no distress and comfortable.   Alone.  EYES: no pallor or icterus OROPHARYNX: no thrush or ulceration; good dentition  NECK: supple, no masses felt LYMPH:  no palpable lymphadenopathy in the cervical, axillary or inguinal regions LUNGS: clear to auscultation and  No wheeze or crackles HEART/CVS: regular rate & rhythm and no murmurs; No lower extremity edema ABDOMEN:abdomen soft, non-tender and normal bowel sounds Musculoskeletal:no cyanosis of digits and no clubbing  PSYCH: alert & oriented x 3 with fluent speech NEURO: no focal motor/sensory deficits SKIN:  no rashes or significant lesions  LABORATORY DATA:  I have reviewed the data as listed    Component Value Date/Time   NA 140 08/24/2015 1456   NA 140 10/08/2013 1021   K 4.4 08/24/2015 1456   K 4.3 10/08/2013 1021   CL 106 08/24/2015 1456   CL 103 10/08/2013 1021   CO2 29 08/24/2015 1456   CO2 31 10/08/2013 1021   GLUCOSE 89 08/24/2015 1456   GLUCOSE 98 10/08/2013 1021   BUN 14 08/24/2015 1456   BUN 11 10/08/2013 1021   CREATININE 0.69 08/24/2015 1456   CREATININE 0.89 03/25/2014 0938   CALCIUM 9.1 08/24/2015  1456   CALCIUM 8.4 (L) 10/08/2013 1021   PROT 6.9 08/24/2015 1456   PROT 6.7 03/25/2014 0938   ALBUMIN 4.5 08/24/2015 1456   ALBUMIN 3.7 03/25/2014 0938   AST 26 08/24/2015 1456   AST 22 03/25/2014 0938   ALT 33 08/24/2015 1456   ALT 35 03/25/2014 0938   ALKPHOS 93 08/24/2015 1456   ALKPHOS 84 03/25/2014 0938   BILITOT 0.5 08/24/2015 1456   BILITOT 0.6 03/25/2014 0938   GFRNONAA >60 08/24/2015 1456   GFRNONAA >60 03/25/2014 0938   GFRNONAA >60 12/31/2013 0937   GFRAA >60 08/24/2015 1456   GFRAA >60 03/25/2014 0938   GFRAA >60 12/31/2013 0937    No results found for: SPEP, UPEP  Lab Results  Component Value Date   WBC 6.1 08/24/2015   NEUTROABS 3.6 08/24/2015   HGB 15.3 08/24/2015   HCT 42.8 08/24/2015   MCV 116.4 (H) 08/24/2015   PLT 177 08/24/2015      Chemistry      Component Value Date/Time   NA 140 08/24/2015 1456   NA 140 10/08/2013 1021   K 4.4 08/24/2015 1456   K 4.3 10/08/2013 1021   CL 106 08/24/2015 1456   CL 103 10/08/2013 1021   CO2 29 08/24/2015 1456   CO2 31 10/08/2013 1021   BUN 14 08/24/2015 1456   BUN 11 10/08/2013 1021   CREATININE 0.69 08/24/2015 1456   CREATININE 0.89 03/25/2014 0938      Component Value Date/Time   CALCIUM 9.1 08/24/2015 1456   CALCIUM 8.4 (L) 10/08/2013 1021   ALKPHOS 93 08/24/2015 1456   ALKPHOS 84 03/25/2014 0938   AST 26 08/24/2015 1456   AST 22 03/25/2014 0938   ALT 33 08/24/2015 1456   ALT 35 03/25/2014 0938   BILITOT 0.5 08/24/2015 1456   BILITOT 0.6 03/25/2014 0938       RADIOGRAPHIC STUDIES: I have personally reviewed the radiological images as listed and agreed with the findings in the report. No results found.   ASSESSMENT & PLAN:  PV (polycythemia vera) ([place]) # Polycythemia vera; today hematocrit is 43.3. Patient is on Hydrea and aspirin. No concerns for transformation. Check H&H every 3 months; possible phlebotomy.  # Risk of secondary malignancies was discussed- colonoscopy 2015- Normal;next  10 years; mammo-due this year.   # Follow-up H&H 3 months; possible phlebotomy follow-up with me in 6 months   Orders Placed This Encounter  Procedures  . Hematocrit ([facility])    Standing Status:   Standing    Number of Occurrences:   2    Standing Expiration Date:   01/02/2017  . Hemoglobin [facility])    Standing Status:   Standing    Number of Occurrences:   2    Standing Expiration Date:   01/02/2017   All questions were answered. The patient  knows to call the clinic with any problems, questions or concerns.      Cammie Sickle, MD 01/03/2016 1:07 PM

## 2016-01-04 ENCOUNTER — Inpatient Hospital Stay: Payer: 59

## 2016-03-15 ENCOUNTER — Other Ambulatory Visit: Payer: Self-pay | Admitting: *Deleted

## 2016-03-15 DIAGNOSIS — D45 Polycythemia vera: Secondary | ICD-10-CM

## 2016-03-15 MED ORDER — HYDROXYUREA 500 MG PO CAPS: ORAL_CAPSULE | ORAL | 3 refills | Status: DC

## 2016-04-03 ENCOUNTER — Inpatient Hospital Stay: Payer: 59 | Attending: Internal Medicine

## 2016-04-03 ENCOUNTER — Inpatient Hospital Stay: Payer: 59

## 2016-04-03 DIAGNOSIS — D45 Polycythemia vera: Secondary | ICD-10-CM | POA: Insufficient documentation

## 2016-04-03 LAB — HEMOGLOBIN: HEMOGLOBIN: 14.8 g/dL (ref 12.0–16.0)

## 2016-04-03 LAB — HEMATOCRIT: HEMATOCRIT: 42.4 % (ref 35.0–47.0)

## 2016-04-23 DIAGNOSIS — Z0000 Encounter for general adult medical examination without abnormal findings: Secondary | ICD-10-CM | POA: Diagnosis not present

## 2016-04-23 DIAGNOSIS — D45 Polycythemia vera: Secondary | ICD-10-CM | POA: Diagnosis not present

## 2016-05-10 DIAGNOSIS — H9311 Tinnitus, right ear: Secondary | ICD-10-CM | POA: Diagnosis not present

## 2016-06-29 ENCOUNTER — Other Ambulatory Visit: Payer: Self-pay | Admitting: *Deleted

## 2016-06-29 DIAGNOSIS — M545 Low back pain: Secondary | ICD-10-CM | POA: Diagnosis not present

## 2016-06-29 DIAGNOSIS — D45 Polycythemia vera: Secondary | ICD-10-CM

## 2016-07-03 ENCOUNTER — Inpatient Hospital Stay: Payer: 59

## 2016-07-03 ENCOUNTER — Inpatient Hospital Stay (HOSPITAL_BASED_OUTPATIENT_CLINIC_OR_DEPARTMENT_OTHER): Payer: 59 | Admitting: Internal Medicine

## 2016-07-03 ENCOUNTER — Inpatient Hospital Stay: Payer: 59 | Attending: Internal Medicine

## 2016-07-03 VITALS — BP 118/63 | HR 75 | Temp 98.3°F | Resp 18 | Ht 65.0 in | Wt 222.0 lb

## 2016-07-03 DIAGNOSIS — Z880 Allergy status to penicillin: Secondary | ICD-10-CM

## 2016-07-03 DIAGNOSIS — D45 Polycythemia vera: Secondary | ICD-10-CM

## 2016-07-03 DIAGNOSIS — Z803 Family history of malignant neoplasm of breast: Secondary | ICD-10-CM | POA: Diagnosis not present

## 2016-07-03 DIAGNOSIS — Z87891 Personal history of nicotine dependence: Secondary | ICD-10-CM | POA: Diagnosis not present

## 2016-07-03 DIAGNOSIS — Z79899 Other long term (current) drug therapy: Secondary | ICD-10-CM | POA: Diagnosis not present

## 2016-07-03 DIAGNOSIS — R233 Spontaneous ecchymoses: Secondary | ICD-10-CM | POA: Diagnosis not present

## 2016-07-03 DIAGNOSIS — Z7982 Long term (current) use of aspirin: Secondary | ICD-10-CM | POA: Insufficient documentation

## 2016-07-03 LAB — HEMOGLOBIN: HEMOGLOBIN: 14.6 g/dL (ref 12.0–16.0)

## 2016-07-03 LAB — HEMATOCRIT: HCT: 42.3 % (ref 35.0–47.0)

## 2016-07-03 NOTE — Assessment & Plan Note (Addendum)
#   Polycythemia vera; JAK-2 positive Today hematocrit is 42.3. Patient is on Hydrea [2 every days; 1 on sat/sun] and aspirin. Tolerating Hydrea well.  # No concerns for transformation. Check H&H every 3 months; possible phlebotomy.  # Follow-up H&H 3 months; possible phlebotomy follow-up with me in 6 months/

## 2016-07-03 NOTE — Progress Notes (Signed)
[place] OFFICE PROGRESS NOTE  Patient Care Team: Kirk Ruths, MD as PCP - General (Internal Medicine)  Cancer Staging No matching staging information was found for the patient.   Oncology History   Diagnosis: Low- risk [id number]- positive Polycythemia Vera   HPI: Initially presented with elevated hemoglobin and white blood cell count in 2011. The workup as outlined below: a. 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 b. 12/23/09: EPO level 2.5 extremely low, Lap score 77, Bcr-Abl -Carbon monoxide level 0.9 %, CLL profile: monoclonal b cell population, c. 12/27/09 : abd U/S: splenomegaly 20 cm. d. 12/29/09: bone marrow biopsy and aspiration: hypercellular marrow with trilineage hematopoiesis.  No malignancy or dysplasia identified in marrow specimen.  11 XY. Patient was started on phlebotomies, but hydroxyurea had to be initiated because of difficulties performing phlebotomies secondary to poor venous access.     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 12/23/09: EPO level 2.5 extremely low, Lap score 77, Bcr-Abl -Carbon monoxide level 0.9 %, CLL profile: monoclonal b cell population, c. 12/27/09 : abd U/S: splenomegaly 20 cm.; . 12/29/09: bone marrow biopsy and aspiration: hypercellular marrow with trilineage hematopoiesis.  No malignancy or dysplasia identified in marrow specimen.  22 XY. Patient was started on phlebotomies, but hydroxyurea had to be initiated because of difficulties performing phlebotomies secondary to poor venous access. M-F- 500 BID; sat-Sun 500/day.   INTERVAL HISTORY:  Melissa Patrick 56 y.o.  female pleasant patient above history of Polycythemia vera currently on aspirin and Hydrea; requiring intermittent phlebotomies is here for follow-up.  No strokes or  thrombo-embolic events. Tolerating Hydrea well without any major problems. Patient denies any rash and palms and soles. Denies any weight loss. Denies any sores in the mouth. Denies any tingling or numbness.   REVIEW OF SYSTEMS:  A complete 10 point review of system is done which is negative except mentioned above/history of present illness.   PAST MEDICAL HISTORY :  Past Medical History:  Diagnosis Date  . PV (polycythemia vera) ([place]) 09/08/2014    PAST SURGICAL HISTORY :   Past Surgical History:  Procedure Laterality Date  . APPENDECTOMY    . TONSILLECTOMY      FAMILY HISTORY :   Family History  Problem Relation Age of Onset  . Cancer Mother 42    breast  . Arthritis Mother   . Cancer Sister     skin  . Stroke Maternal Uncle   . Stroke Maternal Grandmother     SOCIAL HISTORY:   Social History  Substance Use Topics  . Smoking status: Former Smoker    Quit date: 02/23/2010  . Smokeless tobacco: Never Used  . Alcohol use 0.0 oz/week     Comment: occasional about once a year at most    ALLERGIES:  is allergic to dextromoramide and penicillin g.  MEDICATIONS:  Current Outpatient Prescriptions  Medication Sig Dispense Refill  . aspirin 81 MG tablet Take 81 mg by mouth daily.    . hydroxyurea (HYDREA) 500 MG capsule Take 2 capsules on Monday- Friday. 1 cap Sunday and Saturday 60 capsule 3  . baclofen (LIORESAL) 10 MG tablet Take 1 tablet by mouth as needed for spasms. Back spasms  0   No current facility-administered medications for this visit.  PHYSICAL EXAMINATION: ECOG PERFORMANCE STATUS: 0 - Asymptomatic  BP 118/63 (BP Location: Right Arm, Patient Position: Sitting)   Pulse 75   Temp 98.3 F (36.8 C) (Oral)   Resp 18   Ht '5\' 5"'  (1.651 m)   Wt 222 lb 0.1 oz (100.7 kg)   BMI 36.94 kg/m   Filed Weights   07/03/16 1034  Weight: 222 lb 0.1 oz (100.7 kg)    GENERAL: Well-nourished well-developed; Alert, no distress and comfortable.   Alone.  EYES: no  pallor or icterus OROPHARYNX: no thrush or ulceration; good dentition  NECK: supple, no masses felt LYMPH:  no palpable lymphadenopathy in the cervical, axillary or inguinal regions LUNGS: clear to auscultation and  No wheeze or crackles HEART/CVS: regular rate & rhythm and no murmurs; No lower extremity edema ABDOMEN:abdomen soft, non-tender and normal bowel sounds Musculoskeletal:no cyanosis of digits and no clubbing  PSYCH: alert & oriented x 3 with fluent speech NEURO: no focal motor/sensory deficits SKIN:  no rashes or significant lesions  LABORATORY DATA:  I have reviewed the data as listed    Component Value Date/Time   NA 140 08/24/2015 1456   NA 140 10/08/2013 1021   K 4.4 08/24/2015 1456   K 4.3 10/08/2013 1021   CL 106 08/24/2015 1456   CL 103 10/08/2013 1021   CO2 29 08/24/2015 1456   CO2 31 10/08/2013 1021   GLUCOSE 89 08/24/2015 1456   GLUCOSE 98 10/08/2013 1021   BUN 14 08/24/2015 1456   BUN 11 10/08/2013 1021   CREATININE 0.69 08/24/2015 1456   CREATININE 0.89 03/25/2014 0938   CALCIUM 9.1 08/24/2015 1456   CALCIUM 8.4 (L) 10/08/2013 1021   PROT 6.9 08/24/2015 1456   PROT 6.7 03/25/2014 0938   ALBUMIN 4.5 08/24/2015 1456   ALBUMIN 3.7 03/25/2014 0938   AST 26 08/24/2015 1456   AST 22 03/25/2014 0938   ALT 33 08/24/2015 1456   ALT 35 03/25/2014 0938   ALKPHOS 93 08/24/2015 1456   ALKPHOS 84 03/25/2014 0938   BILITOT 0.5 08/24/2015 1456   BILITOT 0.6 03/25/2014 0938   GFRNONAA >60 08/24/2015 1456   GFRNONAA >60 03/25/2014 0938   GFRNONAA >60 12/31/2013 0937   GFRAA >60 08/24/2015 1456   GFRAA >60 03/25/2014 0938   GFRAA >60 12/31/2013 0937    No results found for: SPEP, UPEP  Lab Results  Component Value Date   WBC 5.6 01/03/2016   NEUTROABS 3.3 01/03/2016   HGB 14.6 07/03/2016   HCT 42.3 07/03/2016   MCV 115.8 (H) 01/03/2016   PLT 173 01/03/2016      Chemistry      Component Value Date/Time   NA 140 08/24/2015 1456   NA 140 10/08/2013  1021   K 4.4 08/24/2015 1456   K 4.3 10/08/2013 1021   CL 106 08/24/2015 1456   CL 103 10/08/2013 1021   CO2 29 08/24/2015 1456   CO2 31 10/08/2013 1021   BUN 14 08/24/2015 1456   BUN 11 10/08/2013 1021   CREATININE 0.69 08/24/2015 1456   CREATININE 0.89 03/25/2014 0938      Component Value Date/Time   CALCIUM 9.1 08/24/2015 1456   CALCIUM 8.4 (L) 10/08/2013 1021   ALKPHOS 93 08/24/2015 1456   ALKPHOS 84 03/25/2014 0938   AST 26 08/24/2015 1456   AST 22 03/25/2014 0938   ALT 33 08/24/2015 1456   ALT 35 03/25/2014 0938   BILITOT 0.5 08/24/2015 1456   BILITOT 0.6 03/25/2014 3790  RADIOGRAPHIC STUDIES: I have personally reviewed the radiological images as listed and agreed with the findings in the report. No results found.   ASSESSMENT & PLAN:  PV (polycythemia vera) ([place]) # Polycythemia vera; JAK-2 positive Today hematocrit is 42.3. Patient is on Hydrea [2 every days; 1 on sat/sun] and aspirin. Tolerating Hydrea well.  # No concerns for transformation. Check H&H every 3 months; possible phlebotomy.  # Follow-up H&H 3 months; possible phlebotomy follow-up with me in 6 months/   Orders Placed This Encounter  Procedures  . CBC with Differential/Platelet    Standing Status:   Future    Standing Expiration Date:   04/04/2017  . Comprehensive metabolic panel    Standing Status:   Future    Standing Expiration Date:   04/04/2017  . Lactate dehydrogenase    Standing Status:   Future    Standing Expiration Date:   04/04/2017  . Hematocrit    Standing Status:   Future    Standing Expiration Date:   04/04/2017  . Hemoglobin    Standing Status:   Future    Standing Expiration Date:   04/04/2017  . Hematocrit    Standing Status:   Future    Standing Expiration Date:   01/03/2017  . Hemoglobin    Standing Status:   Future    Standing Expiration Date:   01/03/2017   All questions were answered. The patient knows to call the clinic with any problems, questions or  concerns.      Cammie Sickle, MD 07/03/2016 11:10 AM

## 2016-07-03 NOTE — Progress Notes (Signed)
Patient here for followup- h/o poly vera. Here for possible phlebotomy. Patient recently started on baclofen prn for muscle back spasms. Has not used the baclofen in the last 2 days. No other medical complaints.

## 2016-07-31 ENCOUNTER — Other Ambulatory Visit: Payer: Self-pay | Admitting: Internal Medicine

## 2016-07-31 DIAGNOSIS — D45 Polycythemia vera: Secondary | ICD-10-CM

## 2016-09-03 ENCOUNTER — Other Ambulatory Visit: Payer: Self-pay | Admitting: Internal Medicine

## 2016-09-03 DIAGNOSIS — D45 Polycythemia vera: Secondary | ICD-10-CM

## 2016-10-01 ENCOUNTER — Inpatient Hospital Stay: Payer: 59 | Attending: Internal Medicine

## 2016-10-01 ENCOUNTER — Inpatient Hospital Stay: Payer: 59

## 2016-10-01 DIAGNOSIS — D45 Polycythemia vera: Secondary | ICD-10-CM | POA: Diagnosis not present

## 2016-10-01 LAB — HEMOGLOBIN: HEMOGLOBIN: 14.4 g/dL (ref 12.0–16.0)

## 2016-10-01 LAB — HEMATOCRIT: HEMATOCRIT: 41.5 % (ref 35.0–47.0)

## 2016-10-02 ENCOUNTER — Other Ambulatory Visit: Payer: 59

## 2016-10-03 ENCOUNTER — Other Ambulatory Visit: Payer: 59

## 2016-10-08 ENCOUNTER — Other Ambulatory Visit: Payer: Self-pay | Admitting: Internal Medicine

## 2016-10-08 DIAGNOSIS — D45 Polycythemia vera: Secondary | ICD-10-CM

## 2016-11-20 ENCOUNTER — Other Ambulatory Visit: Payer: Self-pay | Admitting: Internal Medicine

## 2016-11-20 DIAGNOSIS — D45 Polycythemia vera: Secondary | ICD-10-CM

## 2016-12-24 ENCOUNTER — Other Ambulatory Visit: Payer: Self-pay | Admitting: Internal Medicine

## 2016-12-24 DIAGNOSIS — D45 Polycythemia vera: Secondary | ICD-10-CM

## 2016-12-26 DIAGNOSIS — H35413 Lattice degeneration of retina, bilateral: Secondary | ICD-10-CM | POA: Diagnosis not present

## 2016-12-26 DIAGNOSIS — H33113 Cyst of ora serrata, bilateral: Secondary | ICD-10-CM | POA: Diagnosis not present

## 2017-01-01 ENCOUNTER — Inpatient Hospital Stay: Payer: 59 | Attending: Internal Medicine | Admitting: Internal Medicine

## 2017-01-01 ENCOUNTER — Inpatient Hospital Stay: Payer: 59

## 2017-01-01 VITALS — BP 130/89 | HR 80 | Temp 97.6°F | Resp 20 | Ht 65.0 in | Wt 216.1 lb

## 2017-01-01 DIAGNOSIS — Z79899 Other long term (current) drug therapy: Secondary | ICD-10-CM | POA: Diagnosis not present

## 2017-01-01 DIAGNOSIS — D45 Polycythemia vera: Secondary | ICD-10-CM

## 2017-01-01 DIAGNOSIS — L988 Other specified disorders of the skin and subcutaneous tissue: Secondary | ICD-10-CM | POA: Diagnosis not present

## 2017-01-01 DIAGNOSIS — Z880 Allergy status to penicillin: Secondary | ICD-10-CM

## 2017-01-01 DIAGNOSIS — Z87891 Personal history of nicotine dependence: Secondary | ICD-10-CM | POA: Diagnosis not present

## 2017-01-01 DIAGNOSIS — Z7982 Long term (current) use of aspirin: Secondary | ICD-10-CM

## 2017-01-01 DIAGNOSIS — Z803 Family history of malignant neoplasm of breast: Secondary | ICD-10-CM

## 2017-01-01 DIAGNOSIS — Z808 Family history of malignant neoplasm of other organs or systems: Secondary | ICD-10-CM | POA: Diagnosis not present

## 2017-01-01 LAB — CBC WITH DIFFERENTIAL/PLATELET
BASOS ABS: 0 10*3/uL (ref 0–0.1)
Basophils Relative: 1 %
EOS PCT: 1 %
Eosinophils Absolute: 0.1 10*3/uL (ref 0–0.7)
HCT: 43.5 % (ref 35.0–47.0)
HEMOGLOBIN: 15.4 g/dL (ref 12.0–16.0)
LYMPHS ABS: 1.6 10*3/uL (ref 1.0–3.6)
LYMPHS PCT: 26 %
MCH: 40.6 pg — ABNORMAL HIGH (ref 26.0–34.0)
MCHC: 35.5 g/dL (ref 32.0–36.0)
MCV: 114.4 fL — AB (ref 80.0–100.0)
Monocytes Absolute: 0.4 10*3/uL (ref 0.2–0.9)
Monocytes Relative: 7 %
NEUTROS ABS: 3.9 10*3/uL (ref 1.4–6.5)
NEUTROS PCT: 65 %
PLATELETS: 173 10*3/uL (ref 150–440)
RBC: 3.8 MIL/uL (ref 3.80–5.20)
RDW: 13.7 % (ref 11.5–14.5)
WBC: 6 10*3/uL (ref 3.6–11.0)

## 2017-01-01 LAB — COMPREHENSIVE METABOLIC PANEL
ALK PHOS: 83 U/L (ref 38–126)
ALT: 28 U/L (ref 14–54)
AST: 22 U/L (ref 15–41)
Albumin: 4.5 g/dL (ref 3.5–5.0)
Anion gap: 7 (ref 5–15)
BILIRUBIN TOTAL: 0.7 mg/dL (ref 0.3–1.2)
BUN: 14 mg/dL (ref 6–20)
CALCIUM: 9 mg/dL (ref 8.9–10.3)
CO2: 28 mmol/L (ref 22–32)
Chloride: 104 mmol/L (ref 101–111)
Creatinine, Ser: 0.71 mg/dL (ref 0.44–1.00)
Glucose, Bld: 96 mg/dL (ref 65–99)
Potassium: 4 mmol/L (ref 3.5–5.1)
Sodium: 139 mmol/L (ref 135–145)
TOTAL PROTEIN: 7.5 g/dL (ref 6.5–8.1)

## 2017-01-01 LAB — LACTATE DEHYDROGENASE: LDH: 177 U/L (ref 98–192)

## 2017-01-01 NOTE — Progress Notes (Signed)
[name] OFFICE PROGRESS NOTE  Patient Care Team: Kirk Ruths, MD as PCP - General (Internal Medicine)  Cancer Staging No matching staging information was found for the patient.   Oncology History   Diagnosis: Low- risk [id number]- positive Polycythemia Vera   HPI: Initially presented with elevated hemoglobin and white blood cell count in 2011 [rght foot second toe- black"- Dr.[facility]; Dr.Pandit].  a. 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 b. 12/23/09: EPO level 2.5 extremely low, Lap score 77, Bcr-Abl -Carbon monoxide level 0.9 %, CLL profile: monoclonal b cell population, c. 12/27/09 : abd U/S: splenomegaly 20 cm. d. 12/29/09: bone marrow biopsy and aspiration: hypercellular marrow with trilineage hematopoiesis.  No malignancy or dysplasia identified in marrow specimen.  79 XY. Patient was started on phlebotomies, but hydroxyurea had to be initiated because of difficulties performing phlebotomies secondary to poor venous access.     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 12/23/09: EPO level 2.5 extremely low, Lap score 77, Bcr-Abl -Carbon monoxide level 0.9 %, CLL profile: monoclonal b cell population, c. 12/27/09 : abd U/S: splenomegaly 20 cm.; . 12/29/09: bone marrow biopsy and aspiration: hypercellular marrow with trilineage hematopoiesis.  No malignancy or dysplasia identified in marrow specimen.  44 XY. Patient was started on phlebotomies, but hydroxyurea had to be initiated because of difficulties performing phlebotomies secondary to poor venous access. M-F- 500 BID; sat-Sun 500/day.   INTERVAL HISTORY:  Melissa Patrick 56 y.o.  female pleasant patient above history of Polycythemia vera currently on aspirin and Hydrea; requiring intermittent phlebotomies is here for follow-up.  Patient has not needed any phlebotomies recently. No  strokes or thrombo-embolic events. Tolerating Hydrea well without any major problems. Patient denies any rash and palms and soles. Denies any weight loss. Denies any sores in the mouth.   She states to have noted chronic bruise discoloration of the toes/feet when in dependent position. No tingling or numbness. This improves an elevation. REVIEW OF SYSTEMS:  A complete 10 point review of system is done which is negative except mentioned above/history of present illness.   PAST MEDICAL HISTORY :  Past Medical History:  Diagnosis Date  . PV (polycythemia vera) ([place]) 09/08/2014    PAST SURGICAL HISTORY :   Past Surgical History:  Procedure Laterality Date  . APPENDECTOMY    . TONSILLECTOMY      FAMILY HISTORY :   Family History  Problem Relation Age of Onset  . Cancer Mother 37       breast  . Arthritis Mother   . Cancer Sister        skin  . Stroke Maternal Uncle   . Stroke Maternal Grandmother     SOCIAL HISTORY:   Social History  Substance Use Topics  . Smoking status: Former Smoker    Quit date: 02/23/2010  . Smokeless tobacco: Never Used  . Alcohol use 0.0 oz/week     Comment: occasional about once a year at most    ALLERGIES:  is allergic to dextromoramide and penicillin g.  MEDICATIONS:  Current Outpatient Prescriptions  Medication Sig Dispense Refill  . aspirin 81 MG tablet Take 81 mg by mouth daily.    . hydroxyurea (HYDREA) 500 MG capsule TAKE 2 CAPSULES BY MOUTH MONDAY- FRIDAY, THEN 1 CAPSULE BY MOUTH SATURDAY AND SUNDAY. 60 capsule 3   No current facility-administered medications for  this visit.     PHYSICAL EXAMINATION: ECOG PERFORMANCE STATUS: 0 - Asymptomatic  BP 130/89 (Patient Position: Sitting)   Pulse 80   Temp 97.6 F (36.4 C) (Tympanic)   Resp 20   Ht 5' 5"  (1.651 m)   Wt 216 lb 0.8 oz (98 kg)   BMI 35.95 kg/m   Filed Weights   01/01/17 0948  Weight: 216 lb 0.8 oz (98 kg)    GENERAL: Well-nourished well-developed; Alert, no distress  and comfortable.   Alone.  EYES: no pallor or icterus OROPHARYNX: no thrush or ulceration; good dentition  NECK: supple, no masses felt LYMPH:  no palpable lymphadenopathy in the cervical, axillary or inguinal regions LUNGS: clear to auscultation and  No wheeze or crackles HEART/CVS: regular rate & rhythm and no murmurs; No lower extremity edema ABDOMEN:abdomen soft, non-tender and normal bowel sounds Musculoskeletal:no cyanosis of digits and no clubbing  PSYCH: alert & oriented x 3 with fluent speech NEURO: no focal motor/sensory deficits SKIN:  no rashes or significant lesions  LABORATORY DATA:  I have reviewed the data as listed    Component Value Date/Time   NA 139 01/01/2017 0930   NA 140 10/08/2013 1021   K 4.0 01/01/2017 0930   K 4.3 10/08/2013 1021   CL 104 01/01/2017 0930   CL 103 10/08/2013 1021   CO2 28 01/01/2017 0930   CO2 31 10/08/2013 1021   GLUCOSE 96 01/01/2017 0930   GLUCOSE 98 10/08/2013 1021   BUN 14 01/01/2017 0930   BUN 11 10/08/2013 1021   CREATININE 0.71 01/01/2017 0930   CREATININE 0.89 03/25/2014 0938   CALCIUM 9.0 01/01/2017 0930   CALCIUM 8.4 (L) 10/08/2013 1021   PROT 7.5 01/01/2017 0930   PROT 6.7 03/25/2014 0938   ALBUMIN 4.5 01/01/2017 0930   ALBUMIN 3.7 03/25/2014 0938   AST 22 01/01/2017 0930   AST 22 03/25/2014 0938   ALT 28 01/01/2017 0930   ALT 35 03/25/2014 0938   ALKPHOS 83 01/01/2017 0930   ALKPHOS 84 03/25/2014 0938   BILITOT 0.7 01/01/2017 0930   BILITOT 0.6 03/25/2014 0938   GFRNONAA >60 01/01/2017 0930   GFRNONAA >60 03/25/2014 0938   GFRNONAA >60 12/31/2013 0937   GFRAA >60 01/01/2017 0930   GFRAA >60 03/25/2014 0938   GFRAA >60 12/31/2013 0937    No results found for: SPEP, UPEP  Lab Results  Component Value Date   WBC 6.0 01/01/2017   NEUTROABS 3.9 01/01/2017   HGB 15.4 01/01/2017   HCT 43.5 01/01/2017   MCV 114.4 (H) 01/01/2017   PLT 173 01/01/2017      Chemistry      Component Value Date/Time   NA 139  01/01/2017 0930   NA 140 10/08/2013 1021   K 4.0 01/01/2017 0930   K 4.3 10/08/2013 1021   CL 104 01/01/2017 0930   CL 103 10/08/2013 1021   CO2 28 01/01/2017 0930   CO2 31 10/08/2013 1021   BUN 14 01/01/2017 0930   BUN 11 10/08/2013 1021   CREATININE 0.71 01/01/2017 0930   CREATININE 0.89 03/25/2014 0938      Component Value Date/Time   CALCIUM 9.0 01/01/2017 0930   CALCIUM 8.4 (L) 10/08/2013 1021   ALKPHOS 83 01/01/2017 0930   ALKPHOS 84 03/25/2014 0938   AST 22 01/01/2017 0930   AST 22 03/25/2014 0938   ALT 28 01/01/2017 0930   ALT 35 03/25/2014 0938   BILITOT 0.7 01/01/2017 0930   BILITOT 0.6  03/25/2014 8875       RADIOGRAPHIC STUDIES: I have personally reviewed the radiological images as listed and agreed with the findings in the report. No results found.   ASSESSMENT & PLAN:  PV (polycythemia vera) ([place]) # Polycythemia vera; JAK-2 positive Today hematocrit is 43. Marland Kitchen Patient is on Hydrea [2 every days; 1 on sat/sun] and aspirin. Tolerating Hydrea well.  # Chronic discoloration of the toes/otherwise asymptomatic- stocking/prop up; if not improved then vascular physician referral.  # No concerns for transformation. Check H&H every 3 months; possible phlebotomy.  # Follow-up H&H 3 months; possible phlebotomy follow-up with me in 6 months/   No orders of the defined types were placed in this encounter.  All questions were answered. The patient knows to call the clinic with any problems, questions or concerns.      Cammie Sickle, MD 01/01/2017 7:05 PM

## 2017-01-01 NOTE — Assessment & Plan Note (Addendum)
#   Polycythemia vera; JAK-2 positive Today hematocrit is 43. Marland Kitchen Patient is on Hydrea [2 every days; 1 on sat/sun] and aspirin. Tolerating Hydrea well.  # Chronic discoloration of the toes/otherwise asymptomatic- stocking/prop up; if not improved then vascular physician referral.  # No concerns for transformation. Check H&H every 3 months; possible phlebotomy.  # Follow-up H&H 3 months; possible phlebotomy follow-up with me in 6 months/

## 2017-03-29 ENCOUNTER — Other Ambulatory Visit: Payer: Self-pay | Admitting: Internal Medicine

## 2017-03-29 ENCOUNTER — Other Ambulatory Visit: Payer: Self-pay | Admitting: *Deleted

## 2017-03-29 DIAGNOSIS — D45 Polycythemia vera: Secondary | ICD-10-CM

## 2017-04-02 ENCOUNTER — Inpatient Hospital Stay: Payer: 59 | Attending: Internal Medicine

## 2017-04-02 ENCOUNTER — Inpatient Hospital Stay: Payer: 59

## 2017-04-02 DIAGNOSIS — D45 Polycythemia vera: Secondary | ICD-10-CM | POA: Insufficient documentation

## 2017-04-02 LAB — CBC WITH DIFFERENTIAL/PLATELET
Basophils Absolute: 0 10*3/uL (ref 0–0.1)
Basophils Relative: 1 %
EOS ABS: 0 10*3/uL (ref 0–0.7)
EOS PCT: 1 %
HCT: 41.7 % (ref 35.0–47.0)
HEMOGLOBIN: 14.7 g/dL (ref 12.0–16.0)
LYMPHS ABS: 1.3 10*3/uL (ref 1.0–3.6)
Lymphocytes Relative: 28 %
MCH: 42.1 pg — AB (ref 26.0–34.0)
MCHC: 35.3 g/dL (ref 32.0–36.0)
MCV: 119.1 fL — AB (ref 80.0–100.0)
MONOS PCT: 7 %
Monocytes Absolute: 0.3 10*3/uL (ref 0.2–0.9)
Neutro Abs: 3 10*3/uL (ref 1.4–6.5)
Neutrophils Relative %: 63 %
PLATELETS: 168 10*3/uL (ref 150–440)
RBC: 3.5 MIL/uL — ABNORMAL LOW (ref 3.80–5.20)
RDW: 13.4 % (ref 11.5–14.5)
WBC: 4.7 10*3/uL (ref 3.6–11.0)

## 2017-04-24 DIAGNOSIS — D45 Polycythemia vera: Secondary | ICD-10-CM | POA: Diagnosis not present

## 2017-04-24 DIAGNOSIS — Z0000 Encounter for general adult medical examination without abnormal findings: Secondary | ICD-10-CM | POA: Diagnosis not present

## 2017-04-24 DIAGNOSIS — R300 Dysuria: Secondary | ICD-10-CM | POA: Diagnosis not present

## 2017-06-07 ENCOUNTER — Other Ambulatory Visit: Payer: Self-pay | Admitting: Internal Medicine

## 2017-06-07 DIAGNOSIS — D45 Polycythemia vera: Secondary | ICD-10-CM

## 2017-06-26 DIAGNOSIS — H35413 Lattice degeneration of retina, bilateral: Secondary | ICD-10-CM | POA: Diagnosis not present

## 2017-06-26 DIAGNOSIS — H33113 Cyst of ora serrata, bilateral: Secondary | ICD-10-CM | POA: Diagnosis not present

## 2017-07-02 ENCOUNTER — Inpatient Hospital Stay: Payer: 59 | Attending: Internal Medicine | Admitting: Internal Medicine

## 2017-07-02 ENCOUNTER — Inpatient Hospital Stay: Payer: 59

## 2017-07-02 ENCOUNTER — Other Ambulatory Visit: Payer: Self-pay | Admitting: Internal Medicine

## 2017-07-02 ENCOUNTER — Encounter: Payer: Self-pay | Admitting: Internal Medicine

## 2017-07-02 VITALS — BP 131/80 | HR 73 | Temp 97.9°F | Resp 16 | Wt 210.9 lb

## 2017-07-02 DIAGNOSIS — Z87891 Personal history of nicotine dependence: Secondary | ICD-10-CM | POA: Insufficient documentation

## 2017-07-02 DIAGNOSIS — Z880 Allergy status to penicillin: Secondary | ICD-10-CM | POA: Diagnosis not present

## 2017-07-02 DIAGNOSIS — Z808 Family history of malignant neoplasm of other organs or systems: Secondary | ICD-10-CM | POA: Diagnosis not present

## 2017-07-02 DIAGNOSIS — D45 Polycythemia vera: Secondary | ICD-10-CM | POA: Diagnosis not present

## 2017-07-02 DIAGNOSIS — Z803 Family history of malignant neoplasm of breast: Secondary | ICD-10-CM

## 2017-07-02 DIAGNOSIS — Z79899 Other long term (current) drug therapy: Secondary | ICD-10-CM | POA: Diagnosis not present

## 2017-07-02 DIAGNOSIS — Z7982 Long term (current) use of aspirin: Secondary | ICD-10-CM | POA: Insufficient documentation

## 2017-07-02 LAB — CBC WITH DIFFERENTIAL/PLATELET
Basophils Absolute: 0 10*3/uL (ref 0–0.1)
Basophils Relative: 1 %
EOS PCT: 1 %
Eosinophils Absolute: 0 10*3/uL (ref 0–0.7)
HEMATOCRIT: 42.7 % (ref 35.0–47.0)
Hemoglobin: 15 g/dL (ref 12.0–16.0)
LYMPHS ABS: 1.2 10*3/uL (ref 1.0–3.6)
LYMPHS PCT: 28 %
MCH: 40.1 pg — AB (ref 26.0–34.0)
MCHC: 35.2 g/dL (ref 32.0–36.0)
MCV: 113.9 fL — AB (ref 80.0–100.0)
MONO ABS: 0.4 10*3/uL (ref 0.2–0.9)
Monocytes Relative: 9 %
NEUTROS ABS: 2.7 10*3/uL (ref 1.4–6.5)
Neutrophils Relative %: 61 %
Platelets: 171 10*3/uL (ref 150–440)
RBC: 3.75 MIL/uL — AB (ref 3.80–5.20)
RDW: 13.8 % (ref 11.5–14.5)
WBC: 4.3 10*3/uL (ref 3.6–11.0)

## 2017-07-02 LAB — COMPREHENSIVE METABOLIC PANEL
ALBUMIN: 4.4 g/dL (ref 3.5–5.0)
ALT: 43 U/L (ref 14–54)
ANION GAP: 10 (ref 5–15)
AST: 29 U/L (ref 15–41)
Alkaline Phosphatase: 99 U/L (ref 38–126)
BILIRUBIN TOTAL: 0.8 mg/dL (ref 0.3–1.2)
BUN: 14 mg/dL (ref 6–20)
CHLORIDE: 106 mmol/L (ref 101–111)
CO2: 24 mmol/L (ref 22–32)
Calcium: 9 mg/dL (ref 8.9–10.3)
Creatinine, Ser: 0.66 mg/dL (ref 0.44–1.00)
GFR calc Af Amer: >60 mL/min (ref >60)
GFR calc non Af Amer: >60 mL/min (ref >60)
GLUCOSE: 97 mg/dL (ref 65–99)
Potassium: 4 mmol/L (ref 3.5–5.1)
SODIUM: 140 mmol/L (ref 135–145)
Total Protein: 7.3 g/dL (ref 6.5–8.1)

## 2017-07-02 LAB — LACTATE DEHYDROGENASE: LDH: 174 U/L (ref 98–192)

## 2017-07-02 NOTE — Assessment & Plan Note (Addendum)
#   Polycythemia vera; JAK-2 positive Today hematocrit is 43. Marland Kitchen Patient is on Hydrea [2 every days; 1 on sat/sun] and aspirin.   # Tolerating Hydrea well;  No concerns for transformation. Not needing any phlebotomies in last few years.   # possible phlebotomy follow-up with me in 6 months.

## 2017-07-02 NOTE — Progress Notes (Signed)
[place] OFFICE PROGRESS NOTE  Patient Care Team: Kirk Ruths, MD as PCP - General (Internal Medicine)  Cancer Staging No matching staging information was found for the patient.   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  a. 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 b. 12/23/09: EPO level 2.5 extremely low, Lap score 77, Bcr-Abl -Carbon monoxide level 0.9 %, CLL profile: monoclonal b cell population, c. 12/27/09 : abd U/S: splenomegaly 20 cm. d. 12/29/09: bone marrow biopsy and aspiration: hypercellular marrow with trilineage hematopoiesis.  No malignancy or dysplasia identified in marrow specimen.  61 XY. Patient was started on phlebotomies, but hydroxyurea had to be initiated because of difficulties performing phlebotomies secondary to poor venous access.     PV (polycythemia vera) (HCC)     INTERVAL HISTORY:  Melissa Patrick 57 y.o.  female pleasant patient above history of Polycythemia vera currently on aspirin and Hydrea-is here for follow-up.  Patient has not need any phlebotomies in the last few years. No strokes or thrombo-embolic events. Tolerating Hydrea well without any major problems. Patient denies any rash and palms and soles. Denies any weight loss. Denies any sores in the mouth.  No night sweats no weight loss.  No fatigue.  REVIEW OF SYSTEMS:  A complete 10 point review of system is done which is negative except mentioned above/history of present illness.   PAST MEDICAL HISTORY :  Past Medical History:  Diagnosis Date  . PV (polycythemia vera) ([place]) 09/08/2014    PAST SURGICAL HISTORY :   Past Surgical History:  Procedure Laterality Date  . APPENDECTOMY    . TONSILLECTOMY      FAMILY HISTORY :   Family History  Problem Relation  Age of Onset  . Cancer Mother 78       breast  . Arthritis Mother   . Cancer Sister        skin  . Stroke Maternal Uncle   . Stroke Maternal Grandmother     SOCIAL HISTORY:   Social History   Tobacco Use  . Smoking status: Former Smoker    Last attempt to quit: 02/23/2010    Years since quitting: 7.3  . Smokeless tobacco: Never Used  Substance Use Topics  . Alcohol use: Yes    Alcohol/week: 0.0 oz    Comment: occasional about once a year at most  . Drug use: No    ALLERGIES:  is allergic to dextromoramide and penicillin g.  MEDICATIONS:  Current Outpatient Medications  Medication Sig Dispense Refill  . aspirin 81 MG tablet Take 81 mg by mouth daily.    . Cholecalciferol (VITAMIN D) 2000 units tablet Take by mouth.    . hydroxyurea (HYDREA) 500 MG capsule TAKE 2 CAPSULES BY MOUTH MONDAY- FRIDAY, THEN 1 CAPSULE BY MOUTH SATURDAY AND SUNDAY. 60 capsule 2   No current facility-administered medications for this visit.     PHYSICAL EXAMINATION: ECOG PERFORMANCE STATUS: 0 - Asymptomatic  BP 131/80 (BP Location: Left Arm, Patient Position: Sitting)   Pulse 73   Temp 97.9 F (36.6 C) (Tympanic)   Resp 16   Wt 210 lb 15 oz (95.7 kg)   BMI 35.10 kg/m   Filed Weights   07/02/17 0951  Weight: 210 lb 15 oz (95.7 kg)    GENERAL: Well-nourished well-developed;  Alert, no distress and comfortable.   Alone.  EYES: no pallor or icterus OROPHARYNX: no thrush or ulceration; good dentition  NECK: supple, no masses felt LYMPH:  no palpable lymphadenopathy in the cervical, axillary or inguinal regions LUNGS: clear to auscultation and  No wheeze or crackles HEART/CVS: regular rate & rhythm and no murmurs; No lower extremity edema ABDOMEN:abdomen soft, non-tender and normal bowel sounds Musculoskeletal:no cyanosis of digits and no clubbing  PSYCH: alert & oriented x 3 with fluent speech NEURO: no focal motor/sensory deficits SKIN:  no rashes or significant lesions  LABORATORY  DATA:  I have reviewed the data as listed    Component Value Date/Time   NA 140 07/02/2017 0925   NA 140 10/08/2013 1021   K 4.0 07/02/2017 0925   K 4.3 10/08/2013 1021   CL 106 07/02/2017 0925   CL 103 10/08/2013 1021   CO2 24 07/02/2017 0925   CO2 31 10/08/2013 1021   GLUCOSE 97 07/02/2017 0925   GLUCOSE 98 10/08/2013 1021   BUN 14 07/02/2017 0925   BUN 11 10/08/2013 1021   CREATININE 0.66 07/02/2017 0925   CREATININE 0.89 03/25/2014 0938   CALCIUM 9.0 07/02/2017 0925   CALCIUM 8.4 (L) 10/08/2013 1021   PROT 7.3 07/02/2017 0925   PROT 6.7 03/25/2014 0938   ALBUMIN 4.4 07/02/2017 0925   ALBUMIN 3.7 03/25/2014 0938   AST 29 07/02/2017 0925   AST 22 03/25/2014 0938   ALT 43 07/02/2017 0925   ALT 35 03/25/2014 0938   ALKPHOS 99 07/02/2017 0925   ALKPHOS 84 03/25/2014 0938   BILITOT 0.8 07/02/2017 0925   BILITOT 0.6 03/25/2014 0938   GFRNONAA >60 07/02/2017 0925   GFRNONAA >60 03/25/2014 0938   GFRNONAA >60 12/31/2013 0937   GFRAA >60 07/02/2017 0925   GFRAA >60 03/25/2014 0938   GFRAA >60 12/31/2013 0937    No results found for: SPEP, UPEP  Lab Results  Component Value Date   WBC 4.3 07/02/2017   NEUTROABS 2.7 07/02/2017   HGB 15.0 07/02/2017   HCT 42.7 07/02/2017   MCV 113.9 (H) 07/02/2017   PLT 171 07/02/2017      Chemistry      Component Value Date/Time   NA 140 07/02/2017 0925   NA 140 10/08/2013 1021   K 4.0 07/02/2017 0925   K 4.3 10/08/2013 1021   CL 106 07/02/2017 0925   CL 103 10/08/2013 1021   CO2 24 07/02/2017 0925   CO2 31 10/08/2013 1021   BUN 14 07/02/2017 0925   BUN 11 10/08/2013 1021   CREATININE 0.66 07/02/2017 0925   CREATININE 0.89 03/25/2014 0938      Component Value Date/Time   CALCIUM 9.0 07/02/2017 0925   CALCIUM 8.4 (L) 10/08/2013 1021   ALKPHOS 99 07/02/2017 0925   ALKPHOS 84 03/25/2014 0938   AST 29 07/02/2017 0925   AST 22 03/25/2014 0938   ALT 43 07/02/2017 0925   ALT 35 03/25/2014 0938   BILITOT 0.8 07/02/2017  0925   BILITOT 0.6 03/25/2014 0938       RADIOGRAPHIC STUDIES: I have personally reviewed the radiological images as listed and agreed with the findings in the report. No results found.   ASSESSMENT & PLAN:  PV (polycythemia vera) ([place]) # Polycythemia vera; JAK-2 positive Today hematocrit is 43. Marland Kitchen Patient is on Hydrea [2 every days; 1 on sat/sun] and aspirin.   # Tolerating Hydrea well;  No concerns for transformation. Not needing any phlebotomies in last  few years.   # possible phlebotomy follow-up with me in 6 months.    Orders Placed This Encounter  Procedures  . CBC with Differential/Platelet    Standing Status:   Future    Standing Expiration Date:   07/03/2018  . Comprehensive metabolic panel    Standing Status:   Future    Standing Expiration Date:   07/03/2018  . Lactate dehydrogenase    Standing Status:   Future    Standing Expiration Date:   07/03/2018   All questions were answered. The patient knows to call the clinic with any problems, questions or concerns.      Cammie Sickle, MD 07/02/2017 10:15 AM

## 2017-10-14 ENCOUNTER — Other Ambulatory Visit: Payer: Self-pay | Admitting: Internal Medicine

## 2017-10-14 DIAGNOSIS — D45 Polycythemia vera: Secondary | ICD-10-CM

## 2017-11-15 ENCOUNTER — Other Ambulatory Visit: Payer: Self-pay | Admitting: Internal Medicine

## 2017-11-15 DIAGNOSIS — D45 Polycythemia vera: Secondary | ICD-10-CM

## 2017-11-15 NOTE — Telephone Encounter (Signed)
)     Ref Range & Units 52mo ago  WBC 3.6 - 11.0 K/uL 4.3   RBC 3.80 - 5.20 MIL/uL 3.75Low    Hemoglobin 12.0 - 16.0 g/dL 15.0   HCT 35.0 - 47.0 % 42.7   MCV 80.0 - 100.0 fL 113.9High    MCH 26.0 - 34.0 pg 40.1High    MCHC 32.0 - 36.0 g/dL 35.2   RDW 11.5 - 14.5 % 13.8   Platelets 150 - 440 K/uL 171   Neutrophils Relative % % 61   Neutro Abs 1.4 - 6.5 K/uL 2.7   Lymphocytes Relative % 28   Lymphs Abs 1.0 - 3.6 K/uL 1.2   Monocytes Relative % 9   Monocytes Absolute 0.2 - 0.9 K/uL 0.4   Eosinophils Relative % 1   Eosinophils Absolute 0 - 0.7 K/uL 0.0   Basophils Relative % 1   Basophils Absolute 0 - 0.1 K/uL 0.0   Comment: Performed at [facility], [address] [place], [place] [address]  Resulting Agency  [facility]      Specimen Collected: 07/02/17 09:25  Last Resulted: 07/02/17 09:47

## 2017-12-17 ENCOUNTER — Other Ambulatory Visit: Payer: Self-pay | Admitting: Internal Medicine

## 2017-12-17 DIAGNOSIS — D45 Polycythemia vera: Secondary | ICD-10-CM

## 2017-12-31 ENCOUNTER — Inpatient Hospital Stay: Payer: 59 | Attending: Internal Medicine | Admitting: Internal Medicine

## 2017-12-31 ENCOUNTER — Inpatient Hospital Stay: Payer: 59

## 2017-12-31 ENCOUNTER — Encounter: Payer: Self-pay | Admitting: Internal Medicine

## 2017-12-31 VITALS — BP 125/84 | HR 66 | Temp 97.4°F | Resp 16 | Wt 211.0 lb

## 2017-12-31 DIAGNOSIS — D45 Polycythemia vera: Secondary | ICD-10-CM

## 2017-12-31 DIAGNOSIS — R1011 Right upper quadrant pain: Secondary | ICD-10-CM

## 2017-12-31 DIAGNOSIS — Z7982 Long term (current) use of aspirin: Secondary | ICD-10-CM | POA: Diagnosis not present

## 2017-12-31 DIAGNOSIS — Z79899 Other long term (current) drug therapy: Secondary | ICD-10-CM

## 2017-12-31 LAB — COMPREHENSIVE METABOLIC PANEL
ALBUMIN: 4.2 g/dL (ref 3.5–5.0)
ALT: 27 U/L (ref 0–44)
AST: 24 U/L (ref 15–41)
Alkaline Phosphatase: 72 U/L (ref 38–126)
Anion gap: 9 (ref 5–15)
BUN: 12 mg/dL (ref 6–20)
CHLORIDE: 105 mmol/L (ref 98–111)
CO2: 26 mmol/L (ref 22–32)
CREATININE: 0.62 mg/dL (ref 0.44–1.00)
Calcium: 9 mg/dL (ref 8.9–10.3)
GFR calc Af Amer: >60 mL/min (ref >60)
GFR calc non Af Amer: >60 mL/min (ref >60)
GLUCOSE: 86 mg/dL (ref 70–99)
POTASSIUM: 4.2 mmol/L (ref 3.5–5.1)
Sodium: 140 mmol/L (ref 135–145)
Total Bilirubin: 0.8 mg/dL (ref 0.3–1.2)
Total Protein: 7 g/dL (ref 6.5–8.1)

## 2017-12-31 LAB — CBC WITH DIFFERENTIAL/PLATELET
Basophils Absolute: 0 10*3/uL (ref 0–0.1)
Basophils Relative: 0 %
EOS ABS: 0 10*3/uL (ref 0–0.7)
EOS PCT: 1 %
HCT: 40.2 % (ref 35.0–47.0)
Hemoglobin: 14.1 g/dL (ref 12.0–16.0)
LYMPHS ABS: 1.1 10*3/uL (ref 1.0–3.6)
LYMPHS PCT: 26 %
MCH: 42.6 pg — AB (ref 26.0–34.0)
MCHC: 35.2 g/dL (ref 32.0–36.0)
MCV: 121 fL — AB (ref 80.0–100.0)
MONOS PCT: 8 %
Monocytes Absolute: 0.3 10*3/uL (ref 0.2–0.9)
Neutro Abs: 2.8 10*3/uL (ref 1.4–6.5)
Neutrophils Relative %: 65 %
PLATELETS: 162 10*3/uL (ref 150–440)
RBC: 3.32 MIL/uL — AB (ref 3.80–5.20)
RDW: 13.1 % (ref 11.5–14.5)
WBC: 4.3 10*3/uL (ref 3.6–11.0)

## 2017-12-31 LAB — LACTATE DEHYDROGENASE: LDH: 176 U/L (ref 98–192)

## 2017-12-31 NOTE — Progress Notes (Signed)
[place] OFFICE PROGRESS NOTE  Patient Care Team: Kirk Ruths, MD as PCP - General (Internal Medicine)  Cancer Staging No matching staging information was found for the patient.   Oncology History   Diagnosis: Low- risk [id number]- positive Polycythemia Vera   HPI: Initially presented with elevated hemoglobin and white blood cell count in 2011 [rght foot second toe- black"- Dr.Kernodle; Dr.Pandit].  a. 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 b. 12/23/09: EPO level 2.5 extremely low, Lap score 77, Bcr-Abl -Carbon monoxide level 0.9 %, CLL profile: monoclonal b cell population, c. 12/27/09 : abd U/S: splenomegaly 20 cm. d. 12/29/09: bone marrow biopsy and aspiration: hypercellular marrow with trilineage hematopoiesis.  No malignancy or dysplasia identified in marrow specimen.  75 XY. Patient was started on phlebotomies, but hydroxyurea had to be initiated because of difficulties performing phlebotomies secondary to poor venous access. -----------------------------------    DIAGNOSIS: POLYCYTHEMIA VERA- JAK-2 +  GOALS: control  CURRENT/MOST RECENT THERAPY: Hydrea      PV (polycythemia vera) (HCC)     INTERVAL HISTORY:  Melissa Patrick 57 y.o.  female pleasant patient above history of Polycythemia vera currently on aspirin and Hydrea-is here for follow-up.  Patient denies any nausea vomiting diarrhea.  No chest pain or shortness of breath.  No weight loss.  Patient complains of right upper quadrant pain/flank pain had about 2 episodes in the last 1 month.  No alleviating of respirating factors.  No relation with food.  Last for an hour.  Resolved by itself.  Denies any trauma.  Review of Systems  Constitutional: Negative for chills, diaphoresis, fever, malaise/fatigue and weight loss.  HENT: Negative for nosebleeds and sore throat.   Eyes: Negative for double vision.  Respiratory: Negative for cough, hemoptysis, sputum  production, shortness of breath and wheezing.   Cardiovascular: Negative for chest pain, palpitations, orthopnea and leg swelling.  Gastrointestinal: Negative for abdominal pain, blood in stool, constipation, diarrhea, heartburn, melena, nausea and vomiting.  Genitourinary: Negative for dysuria, frequency and urgency.  Musculoskeletal: Negative for back pain and joint pain.  Skin: Negative.  Negative for itching and rash.  Neurological: Negative for dizziness, tingling, focal weakness, weakness and headaches.  Endo/Heme/Allergies: Does not bruise/bleed easily.  Psychiatric/Behavioral: Negative for depression. The patient is not nervous/anxious and does not have insomnia.      PAST MEDICAL HISTORY :  Past Medical History:  Diagnosis Date  . PV (polycythemia vera) ([place]) 09/08/2014    PAST SURGICAL HISTORY :   Past Surgical History:  Procedure Laterality Date  . APPENDECTOMY    . TONSILLECTOMY      FAMILY HISTORY :   Family History  Problem Relation Age of Onset  . Cancer Mother 13       breast  . Arthritis Mother   . Cancer Sister        skin  . Stroke Maternal Uncle   . Stroke Maternal Grandmother     SOCIAL HISTORY:   Social History   Tobacco Use  . Smoking status: Former Smoker    Last attempt to quit: 02/23/2010    Years since quitting: 7.8  . Smokeless tobacco: Never Used  Substance Use Topics  . Alcohol use: Yes    Alcohol/week: 0.0 standard drinks    Comment: occasional about once a year at most  . Drug use: No    ALLERGIES:  is allergic to dextromoramide and penicillin g.  MEDICATIONS:  Current Outpatient Medications  Medication Sig  Dispense Refill  . aspirin 81 MG tablet Take 81 mg by mouth daily.    . Cholecalciferol (VITAMIN D) 2000 units tablet Take by mouth.    . hydroxyurea (HYDREA) 500 MG capsule TAKE 2 CAPSULES BY MOUTH MONDAY- FRIDAY, THEN 1 CAPSULE BY MOUTH SATURDAY AND SUNDAY. 60 capsule 3   No current facility-administered medications for  this visit.     PHYSICAL EXAMINATION: ECOG PERFORMANCE STATUS: 0 - Asymptomatic  BP 125/84 (BP Location: Left Arm, Patient Position: Sitting)   Pulse 66   Temp (!) 97.4 F (36.3 C) (Tympanic)   Resp 16   Wt 211 lb (95.7 kg)   BMI 35.11 kg/m   Filed Weights   12/31/17 1005  Weight: 211 lb (95.7 kg)     Physical Exam  Constitutional: She is oriented to person, place, and time and well-developed, well-nourished, and in no distress.  HENT:  Head: Normocephalic and atraumatic.  Mouth/Throat: Oropharynx is clear and moist. No oropharyngeal exudate.  Eyes: Pupils are equal, round, and reactive to light.  Neck: Normal range of motion. Neck supple.  Cardiovascular: Normal rate and regular rhythm.  Pulmonary/Chest: No respiratory distress. She has no wheezes.  Abdominal: Soft. Bowel sounds are normal. She exhibits no distension and no mass. There is no tenderness. There is no rebound and no guarding.  Musculoskeletal: Normal range of motion. She exhibits no edema or tenderness.  Neurological: She is alert and oriented to person, place, and time.  Skin: Skin is warm.  Psychiatric: Affect normal.     LABORATORY DATA:  I have reviewed the data as listed    Component Value Date/Time   NA 140 12/31/2017 0946   NA 140 10/08/2013 1021   K 4.2 12/31/2017 0946   K 4.3 10/08/2013 1021   CL 105 12/31/2017 0946   CL 103 10/08/2013 1021   CO2 26 12/31/2017 0946   CO2 31 10/08/2013 1021   GLUCOSE 86 12/31/2017 0946   GLUCOSE 98 10/08/2013 1021   BUN 12 12/31/2017 0946   BUN 11 10/08/2013 1021   CREATININE 0.62 12/31/2017 0946   CREATININE 0.89 03/25/2014 0938   CALCIUM 9.0 12/31/2017 0946   CALCIUM 8.4 (L) 10/08/2013 1021   PROT 7.0 12/31/2017 0946   PROT 6.7 03/25/2014 0938   ALBUMIN 4.2 12/31/2017 0946   ALBUMIN 3.7 03/25/2014 0938   AST 24 12/31/2017 0946   AST 22 03/25/2014 0938   ALT 27 12/31/2017 0946   ALT 35 03/25/2014 0938   ALKPHOS 72 12/31/2017 0946   ALKPHOS 84  03/25/2014 0938   BILITOT 0.8 12/31/2017 0946   BILITOT 0.6 03/25/2014 0938   GFRNONAA >60 12/31/2017 0946   GFRNONAA >60 03/25/2014 0938   GFRNONAA >60 12/31/2013 0937   GFRAA >60 12/31/2017 0946   GFRAA >60 03/25/2014 0938   GFRAA >60 12/31/2013 0937    No results found for: SPEP, UPEP  Lab Results  Component Value Date   WBC 4.3 12/31/2017   NEUTROABS 2.8 12/31/2017   HGB 14.1 12/31/2017   HCT 40.2 12/31/2017   MCV 121.0 (H) 12/31/2017   PLT 162 12/31/2017      Chemistry      Component Value Date/Time   NA 140 12/31/2017 0946   NA 140 10/08/2013 1021   K 4.2 12/31/2017 0946   K 4.3 10/08/2013 1021   CL 105 12/31/2017 0946   CL 103 10/08/2013 1021   CO2 26 12/31/2017 0946   CO2 31 10/08/2013 1021   BUN 12  12/31/2017 0946   BUN 11 10/08/2013 1021   CREATININE 0.62 12/31/2017 0946   CREATININE 0.89 03/25/2014 0938      Component Value Date/Time   CALCIUM 9.0 12/31/2017 0946   CALCIUM 8.4 (L) 10/08/2013 1021   ALKPHOS 72 12/31/2017 0946   ALKPHOS 84 03/25/2014 0938   AST 24 12/31/2017 0946   AST 22 03/25/2014 0938   ALT 27 12/31/2017 0946   ALT 35 03/25/2014 0938   BILITOT 0.8 12/31/2017 0946   BILITOT 0.6 03/25/2014 0938       RADIOGRAPHIC STUDIES: I have personally reviewed the radiological images as listed and agreed with the findings in the report. No results found.   ASSESSMENT & PLAN:  PV (polycythemia vera) ([place]) # Polycythemia vera; JAK-2 positive Today hematocrit is 43. Patient is on Hydrea [2 every days; 1 on sat/sun] and aspirin.  Stable.  # Tolerating Hydrea well;  No concerns for transformation.   # RUQ/flank pain new.-musculoskeletal versus others. Do not suspect any liver or kidney pathology.  Recommend if condition gets worse recommend follow-up with PCP.   # possible phlebotomy follow-up with me in 6 months.    No orders of the defined types were placed in this encounter.  All questions were answered. The patient knows to call the  clinic with any problems, questions or concerns.      Cammie Sickle, MD 12/31/2017 11:03 AM

## 2017-12-31 NOTE — Assessment & Plan Note (Addendum)
#   Polycythemia vera; JAK-2 positive Today hematocrit is 43. Patient is on Hydrea [2 every days; 1 on sat/sun] and aspirin.  Stable.  # Tolerating Hydrea well;  No concerns for transformation.   # RUQ/flank pain new.-musculoskeletal versus others. Do not suspect any liver or kidney pathology.  Recommend if condition gets worse recommend follow-up with PCP.   # possible phlebotomy follow-up with me in 6 months.

## 2018-01-07 DIAGNOSIS — H35363 Drusen (degenerative) of macula, bilateral: Secondary | ICD-10-CM | POA: Diagnosis not present

## 2018-01-07 DIAGNOSIS — H35413 Lattice degeneration of retina, bilateral: Secondary | ICD-10-CM | POA: Diagnosis not present

## 2018-01-07 DIAGNOSIS — H33113 Cyst of ora serrata, bilateral: Secondary | ICD-10-CM | POA: Diagnosis not present

## 2018-05-24 ENCOUNTER — Other Ambulatory Visit: Payer: Self-pay | Admitting: Internal Medicine

## 2018-05-24 DIAGNOSIS — D45 Polycythemia vera: Secondary | ICD-10-CM

## 2018-05-26 NOTE — Telephone Encounter (Signed)
Next appointment 07/01/18  PV (polycythemia vera) ([place])   Ref Range & Units 48moago (12/31/17) 116mogo (07/02/17) 1y53yro (04/02/17) 7yr27yr (01/01/17) 7yr 63yr(10/01/16) 7yr a42yr6/18/18) 7yr ag70yr/20/18) 7yr ago36yr20/18)  WBC 3.6 - 11.0 K/uL 4.3  4.3  4.7  6.0       RBC 3.80 - 5.20 MIL/uL 3.32Low   3.75Low   3.50Low   3.80       Hemoglobin 12.0 - 16.0 g/dL 14.1  15.0  14.7  15.4  14.4   14.6    HCT 35.0 - 47.0 % 40.2  42.7  41.7  43.5   41.5   42.3   MCV 80.0 - 100.0 fL 121.0High   113.9High   119.1High   114.4High        MCH 26.0 - 34.0 pg 42.6High   40.1High   42.1High   40.6High        MCHC 32.0 - 36.0 g/dL 35.2  35.2  35.3  35.5       RDW 11.5 - 14.5 % 13.1  13.8  13.4  13.7       Platelets 150 - 440 K/uL 162  171  168  173       Neutrophils Relative % % 65  61  63  65       Neutro Abs 1.4 - 6.5 K/uL 2.8  2.7  3.0  3.9       Lymphocytes Relative % _0 Lymphs Abs 1.0 - 3.6 K/uL 1.1  1.2  1.3  1.6       Monocytes Relative % _1 Monocytes Absolute 0.2 - 0.9 K/uL 0.3  0.4  0.3  0.4       Eosinophils Relative % _2 Eosinophils Absolute 0 - 0.7 K/uL 0.0  0.0  0.0  0.1       Basophils Relative % 0  _3 Basophils Absolute 0 - 0.1 K/uL 0.0  0.0 CM 0.0  0.0       Comment: Performed at [place] [facility], [place]  [id number] Agency  _4  [facility]      Specimen Collected: 12/31/17 09:46  Last Resulted: 12/31/17 09:57     Lab Flowsheet    Order Details    View Encounter    Lab and Collection Details    Routing    Result History      CM=Additional comments      Other Results from 12/31/2017   Lactate dehydrogenase  Order: [id number]:  Final result  Visible to patient:  Yes (MyChart)  Next appt:  07/01/2018 at 08:45 AM in Oncology ([facility])  Dx:  PV (polycythemia vera) (HCC)   RHoschtonRange & Units 40mo ago 19mo ago 67mogo  L60yr8 - 192 U/L 176  174 CM 177   Comment: Performed at [place] [facility] [place] Agency  [facility]    Specimen Collected: 12/31/17 09:46  Last Resulted: 12/31/17  10:10     Lab Sales promotion account executive and Collection Details    Routing    Result History      CM=Additional comments        Comprehensive metabolic panel  Order: [id number]   Status:  Final result  Visible to patient:  Yes (MyChart)  Next appt:  07/01/2018 at 08:45 AM in Oncology ([facility])  Dx:  PV (polycythemia vera) ([place])   Ref Range & Units 34moago (12/31/17) 174mogo (07/02/17) 1y10yro (01/01/17) 71yr68yr (08/24/15) 4yr 85yr(02/24/15) 4yr a75yr8/18/16) 4yr ag36yr/18/16)  Sodium 135 - 145 mmol/L 140  140  139  140  139     Potassium 3.5 - 5.1 mmol/L 4.2  4.0  4.0  4.4  4.2     Chloride 98 - 111 mmol/L 105  106 R 104 R 106 R 103 R    CO2 22 - 32 mmol/L _0 Glucose, Bld 70 - 99 mg/dL 86  97 R 96 R 89 R 91 R    BUN 6 - 20 mg/dL _1 Creatinine, Ser 0.44 - 1.00 mg/dL 0.62  0.66  0.71  0.69  0.70  0.75    Calcium 8.9 - 10.3 mg/dL 9.0  9.0  9.0  9.1  9.2     Total Protein 6.5 - 8.1 g/dL 7.0  7.3  7.5  6.9  7.1   6.7   Albumin 3.5 - 5.0 g/dL 4.2  4.4  4.5  4.5  4.3   4.1   AST 15 - 41 U/L _2 ALT 0 - 44 U/L 27  43 R 28 R 33 R 32 R  39 R  Alkaline Phosphatase 38 - 126 U/L 72  99  83  93  81   81   Total Bilirubin 0.3 - 1.2 mg/dL 0.8  0.8  0.7  0.5  0.6   0.9   GFR calc non Af Amer >60 mL/min >60  >60  >60  >60  >60  >60    GFR calc Af Amer >60 mL/min >60  >60 CM >60 CM >60 CM >60 CM >60 CM   Comment: (NOTE)  The eGFR has been calculated using the CKD EPI equation.  This calculation has not been validated in all clinical situations.  eGFR's persistently <60 mL/min signify possible Chronic  Kidney  Disease.   Anion gap 5 - _3 CM _4 Comment: Performed at [place] [facility],[place]  [id number] Agency  _5  [facility]      Specimen Collected: 12/31/17 09:46  Last Resulted: 12/31/17 10:10

## 2018-06-25 ENCOUNTER — Other Ambulatory Visit: Payer: Self-pay

## 2018-06-25 DIAGNOSIS — D45 Polycythemia vera: Secondary | ICD-10-CM

## 2018-07-01 ENCOUNTER — Other Ambulatory Visit: Payer: Self-pay | Admitting: Hematology and Oncology

## 2018-07-01 ENCOUNTER — Other Ambulatory Visit: Payer: Self-pay

## 2018-07-01 ENCOUNTER — Inpatient Hospital Stay: Payer: 59 | Attending: Hematology and Oncology

## 2018-07-01 ENCOUNTER — Inpatient Hospital Stay: Payer: 59

## 2018-07-01 ENCOUNTER — Ambulatory Visit: Payer: 59 | Admitting: Internal Medicine

## 2018-07-01 ENCOUNTER — Inpatient Hospital Stay (HOSPITAL_BASED_OUTPATIENT_CLINIC_OR_DEPARTMENT_OTHER): Payer: 59 | Admitting: Hematology and Oncology

## 2018-07-01 ENCOUNTER — Other Ambulatory Visit: Payer: 59

## 2018-07-01 ENCOUNTER — Encounter: Payer: Self-pay | Admitting: Hematology and Oncology

## 2018-07-01 VITALS — BP 130/84 | HR 77 | Temp 97.7°F | Resp 16 | Wt 214.8 lb

## 2018-07-01 DIAGNOSIS — Z87891 Personal history of nicotine dependence: Secondary | ICD-10-CM | POA: Insufficient documentation

## 2018-07-01 DIAGNOSIS — D696 Thrombocytopenia, unspecified: Secondary | ICD-10-CM | POA: Diagnosis not present

## 2018-07-01 DIAGNOSIS — Z7982 Long term (current) use of aspirin: Secondary | ICD-10-CM | POA: Insufficient documentation

## 2018-07-01 DIAGNOSIS — D7589 Other specified diseases of blood and blood-forming organs: Secondary | ICD-10-CM

## 2018-07-01 DIAGNOSIS — D45 Polycythemia vera: Secondary | ICD-10-CM | POA: Diagnosis not present

## 2018-07-01 DIAGNOSIS — Z79899 Other long term (current) drug therapy: Secondary | ICD-10-CM | POA: Insufficient documentation

## 2018-07-01 LAB — COMPREHENSIVE METABOLIC PANEL
ALT: 22 U/L (ref 0–44)
AST: 18 U/L (ref 15–41)
Albumin: 4.3 g/dL (ref 3.5–5.0)
Alkaline Phosphatase: 73 U/L (ref 38–126)
Anion gap: 6 (ref 5–15)
BUN: 14 mg/dL (ref 6–20)
CO2: 27 mmol/L (ref 22–32)
Calcium: 9 mg/dL (ref 8.9–10.3)
Chloride: 104 mmol/L (ref 98–111)
Creatinine, Ser: 0.67 mg/dL (ref 0.44–1.00)
GFR calc Af Amer: >60 mL/min (ref >60)
GFR calc non Af Amer: >60 mL/min (ref >60)
Glucose, Bld: 93 mg/dL (ref 70–99)
Potassium: 4.5 mmol/L (ref 3.5–5.1)
Sodium: 137 mmol/L (ref 135–145)
Total Bilirubin: 0.4 mg/dL (ref 0.3–1.2)
Total Protein: 7.1 g/dL (ref 6.5–8.1)

## 2018-07-01 LAB — CBC WITH DIFFERENTIAL/PLATELET
Abs Immature Granulocytes: 0.01 10*3/uL (ref 0.00–0.07)
Basophils Absolute: 0 10*3/uL (ref 0.0–0.1)
Basophils Relative: 0 %
Eosinophils Absolute: 0 10*3/uL (ref 0.0–0.5)
Eosinophils Relative: 1 %
HCT: 39.3 % (ref 36.0–46.0)
Hemoglobin: 14.3 g/dL (ref 12.0–15.0)
Immature Granulocytes: 0 %
Lymphocytes Relative: 24 %
Lymphs Abs: 1.2 10*3/uL (ref 0.7–4.0)
MCH: 43.3 pg — ABNORMAL HIGH (ref 26.0–34.0)
MCHC: 36.4 g/dL — ABNORMAL HIGH (ref 30.0–36.0)
MCV: 119.1 fL — ABNORMAL HIGH (ref 80.0–100.0)
Monocytes Absolute: 0.4 10*3/uL (ref 0.1–1.0)
Monocytes Relative: 7 %
Neutro Abs: 3.3 10*3/uL (ref 1.7–7.7)
Neutrophils Relative %: 68 %
Platelets: 138 10*3/uL — ABNORMAL LOW (ref 150–400)
RBC: 3.3 MIL/uL — ABNORMAL LOW (ref 3.87–5.11)
RDW: 11.6 % (ref 11.5–15.5)
WBC: 5 10*3/uL (ref 4.0–10.5)
nRBC: 0 % (ref 0.0–0.2)

## 2018-07-01 LAB — LACTATE DEHYDROGENASE: LDH: 163 U/L (ref 98–192)

## 2018-07-01 NOTE — Progress Notes (Signed)
[facility] day:  07/01/2018  Chief Complaint: Melissa Patrick is a 58 y.o. 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  HPI:  The patient was diagnosed with polycythemia rubra vera (PV) in 2011.  She states that she presented with a black right second toe.  Counts were elevated.  She was referred to oncology.  She states that she has seen 3 oncologist.    She underwent a work-up.  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  Epo level was 2.5 (low).  LAP score was 77.  BCR-ABL was negative.  Carbon monoxide level was 0.9%.  CLL profile showed a monoclonal B cell population.  Abdominal ultrasound on 12/27/2009 revealed splenomegaly (20.73 cm).  Bone marrow on 12/29/2009 revealed a hypercellular marrow with trilineage hematopoiesis.  There was no malignancy or dysplasia. Cytogenetics were normal (64, XX).  She began a phlebotomy program.  Hydroxyurea was started in 2016 secondary to poor venous access and associated difficulties performing phlebotomies.  She was initially on hydroxyurea 500 mg a day then increased to her current dose.  Last phlebotomy was on 01/02/2016.  She was last seen by Dr. Tish Men on 12/31/2017.  At that time, she noted RUQ /flank pain x 2 in the prior month.  She denied any B symptoms.  CBC revealed a hematocrit of 40.2, hemoglobin 14.1, MCV 121, platelets 162,000, WBC 4300 with an ANC of 2800.  LDH was 176.  Creatinine was 0.62.  She continued hydroxyurea 1000 mg on Monday-Friday, and 500 mg on Saturday and Sunday.  During the interim, she has done well.  She denies any B symptoms.  She denies any early satiety.  She notes no new medications or herbal products.  She notes a family history of breast cancer (mother), gastric cancer (grandmother), and skin cancer (sister).  She has not had a mammogram since 06/25/2014.  She had a colonoscopy at age 11.    Past Medical History:  Diagnosis Date  .  PV (polycythemia vera) ([place]) 09/08/2014    Past Surgical History:  Procedure Laterality Date  . APPENDECTOMY    . TONSILLECTOMY      Family History  Problem Relation Age of Onset  . Cancer Mother 93       breast  . Arthritis Mother   . Cancer Sister        skin  . Stroke Maternal Uncle   . Stroke Maternal Grandmother     Social History:  reports that she quit smoking about 8 years ago. She has never used smokeless tobacco. She reports current alcohol use. She reports that she does not use drugs.  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  She lives in [place]. The patient is alone today.  Allergies:  Allergies  Allergen Reactions  . Dextromoramide     Other reaction(s): Cough  . Penicillin G Hives    Current Medications: Current Outpatient Medications  Medication Sig Dispense Refill  . aspirin 81 MG tablet Take 81 mg by mouth daily.    . hydroxyurea (HYDREA) 500 MG capsule TAKE 2 CAPSULES BY MOUTH MONDAY- FRIDAY, THEN 1 CAPSULE BY MOUTH SATURDAY AND SUNDAY. 60 capsule 3   No current facility-administered medications for this visit.     Review of Systems:  GENERAL:  Feels good.  Active.  No fevers, sweats or weight loss.  Weight up 3 pounds since last visit. PERFORMANCE STATUS (ECOG):  0 HEENT:  Now  and then sore throat.  Floaters in right eye.  No runny nose,mouth sores or tenderness. Lungs: No shortness of breath or cough.  No hemoptysis. Cardiac:  No chest pain, palpitations, orthopnea, or PND. GI:  No nausea, vomiting, diarrhea, constipation, melena or hematochezia.  Colonoscopy at age 44. GU:  No urgency, frequency, dysuria, or hematuria. Musculoskeletal:  No back pain.  No joint pain.  No muscle tenderness. Extremities:  No pain or swelling. Skin:  No rashes or skin changes. Neuro:  Sometimes has "funny feeling in right thigh" with walking.  No headache, numbness or weakness, balance or coordination issues. Endocrine:  No diabetes, thyroid issues,  hot flashes or night sweats. Psych:  No mood changes, depression or anxiety. Pain:  No focal pain. Review of systems:  All other systems reviewed and found to be negative.  Physical Exam: Blood pressure 130/84, pulse 77, temperature 97.7 F (36.5 C), temperature source Tympanic, resp. rate 16, weight 214 lb 13.4 oz (97.4 kg), SpO2 100 %. GENERAL:  Well developed, well nourished, woman sitting comfortably in the exam room in no acute distress. MENTAL STATUS:  Alert and oriented to person, place and time. HEAD:  Shoulder length brown hair with graying.  Normocephalic, atraumatic, face symmetric, no Cushingoid features. EYES:  Glasses.  Blue eyes.  Pupils equal round and reactive to light and accomodation.  No conjunctivitis or scleral icterus. ENT:  Oropharynx clear without lesion.  Tongue normal.  Dentures.  Mucous membranes moist.  RESPIRATORY:  Clear to auscultation without rales, wheezes or rhonchi. CARDIOVASCULAR:  Regular rate and rhythm without murmur, rub or gallop. ABDOMEN:  Soft, non-tender, with active bowel sounds, and no appreciable hepatosplenomegaly.  No masses. SKIN:  Right upper arm bruise.  No rashes, ulcers or lesions. EXTREMITIES: No edema, no skin discoloration or tenderness.  No palpable cords. LYMPH NODES: No palpable cervical, supraclavicular, axillary or inguinal adenopathy  NEUROLOGICAL: Unremarkable. PSYCH:  Appropriate.   Appointment on 07/01/2018  Component Date Value Ref Range Status  . LDH 07/01/2018 163  98 - 192 U/L Final   Performed at [facility], [address] [place], [place] [address]  . Sodium 07/01/2018 137  135 - 145 mmol/L Final  . Potassium 07/01/2018 4.5  3.5 - 5.1 mmol/L Final  . Chloride 07/01/2018 104  98 - 111 mmol/L Final  . CO2 07/01/2018 27  22 - 32 mmol/L Final  . Glucose, Bld 07/01/2018 93  70 - 99 mg/dL Final  . BUN 07/01/2018 14  6 - 20 mg/dL Final  . Creatinine, Ser 07/01/2018 0.67  0.44 - 1.00 mg/dL Final  .  Calcium 07/01/2018 9.0  8.9 - 10.3 mg/dL Final  . Total Protein 07/01/2018 7.1  6.5 - 8.1 g/dL Final  . Albumin 07/01/2018 4.3  3.5 - 5.0 g/dL Final  . AST 07/01/2018 18  15 - 41 U/L Final  . ALT 07/01/2018 22  0 - 44 U/L Final  . Alkaline Phosphatase 07/01/2018 73  38 - 126 U/L Final  . Total Bilirubin 07/01/2018 0.4  0.3 - 1.2 mg/dL Final  . GFR calc non Af Amer 07/01/2018 >60  >60 mL/min Final  . GFR calc Af Amer 07/01/2018 >60  >60 mL/min Final  . Anion gap 07/01/2018 6  5 - 15 Final   Performed at [facility], [address] [place],  [address]  . WBC 07/01/2018 5.0  4.0 - 10.5 K/uL Final  . RBC 07/01/2018 3.30* 3.87 - 5.11 MIL/uL Final  .  Hemoglobin 07/01/2018 14.3  12.0 - 15.0 g/dL Final  . HCT 07/01/2018 39.3  36.0 - 46.0 % Final  . MCV 07/01/2018 119.1* 80.0 - 100.0 fL Final  . MCH 07/01/2018 43.3* 26.0 - 34.0 pg Final  . MCHC 07/01/2018 36.4* 30.0 - 36.0 g/dL Final  . RDW 07/01/2018 11.6  11.5 - 15.5 % Final  . Platelets 07/01/2018 138* 150 - 400 K/uL Final  . nRBC 07/01/2018 0.0  0.0 - 0.2 % Final  . Neutrophils Relative % 07/01/2018 68  % Final  . Neutro Abs 07/01/2018 3.3  1.7 - 7.7 K/uL Final  . Lymphocytes Relative 07/01/2018 24  % Final  . Lymphs Abs 07/01/2018 1.2  0.7 - 4.0 K/uL Final  . Monocytes Relative 07/01/2018 7  % Final  . Monocytes Absolute 07/01/2018 0.4  0.1 - 1.0 K/uL Final  . Eosinophils Relative 07/01/2018 1  % Final  . Eosinophils Absolute 07/01/2018 0.0  0.0 - 0.5 K/uL Final  . Basophils Relative 07/01/2018 0  % Final  . Basophils Absolute 07/01/2018 0.0  0.0 - 0.1 K/uL Final  . Immature Granulocytes 07/01/2018 0  % Final  . Abs Immature Granulocytes 07/01/2018 0.01  0.00 - 0.07 K/uL Final   Performed at [facility], [address] [place], [place] [address]    Assessment:  Melissa Patrick is a 58 y.o. female with polycythemia rubra vera initially diagnosed in 2011.  She presented with a black right second  toe.  Counts were elevated.    Work-up on 12/23/2009 was +  V617F mutation.  Epo level was 2.5 (low).  LAP score was 77.  BCR-ABL was negative.  Carbon monoxide level was 0.9%.  CLL profile showed a monoclonal B cell population.  Abdominal ultrasound on 12/27/2009 revealed splenomegaly (20.73 cm).  Bone marrow on 12/29/2009 revealed a hypercellular marrow with trilineage hematopoiesis.  There was no malignancy or dysplasia. Cytogenetics were normal (61, XX).  She began a phlebotomy program.  She undergoes phlebotomy if her hematocrit is > 42.   Last phlebotomy was on 01/02/2016.  She is on hydroxyurea 1000 mg on Monday-Friday, and 500 mg on Saturday and Sunday.  She is on a baby aspirin/day.  She has a family history of breast cancer (mother), gastric cancer (grandmother), and skin cancer (sister).  She has not had a mammogram since 06/25/2014.  She had a colonoscopy at age 63.   Symptomatically, she denies any B symptoms.  She denies any early satiety.  She notes no new medications or herbal products.  Plan: 1.   Labs today:  CBC with diff, CMP, LDH. 2.   Polycythemia rubra vera  Discuss entire medical history, diagnosis and management of polycythemia rubra vera.  Hematocrit goal <= 42.  Platelet goal <= 400,000.  Patient has not required a phlebotomy since 12/2015.  Continue hydroxyurea 1000 mg Monday - Friday and 500 mg on saturdays and Sundays.  Continue baby aspirin/day.  Discuss CBC every 3 months. 3.   Thrombocytopenia  Mild thrombocytopenia.  Platelet count 138,000.  Etiology unclear.  Patient notes no new medications or herbal products.  Discuss CBC with diff in 1 month. 4.   Health maintenance  Patient has not had a mammogram since 06/2014.  Encourage follow-up with Dr. Ouida Sills. 5.   RTC in 1 month for CBC with diff. 6.   RTC in 3 months for labs (CBC with diff, CMP). 7.   RTC in 6 months for MD assessment and labs (CBC  with diff, CMP, LDH).     Lequita Asal, MD   07/01/2018, 9:29 AM

## 2018-07-01 NOTE — Progress Notes (Signed)
Pt here for follow up.   Sees "floating black spots" in right eye that started 1 month ago. States she has talked to her ophthalmologist and was told to make Dr. Mike Gip aware of symptoms. Reports she has appt. Scheduled with them soon to have evaluation.

## 2018-07-08 ENCOUNTER — Telehealth: Payer: Self-pay

## 2018-07-08 NOTE — Telephone Encounter (Signed)
Per patient request, letter was faxed to patient's workplace stating she is allowed to continue working at this time.

## 2018-07-29 ENCOUNTER — Inpatient Hospital Stay: Payer: 59 | Attending: Hematology and Oncology

## 2018-07-29 ENCOUNTER — Other Ambulatory Visit: Payer: Self-pay

## 2018-07-29 DIAGNOSIS — D45 Polycythemia vera: Secondary | ICD-10-CM | POA: Insufficient documentation

## 2018-07-29 DIAGNOSIS — D696 Thrombocytopenia, unspecified: Secondary | ICD-10-CM | POA: Diagnosis not present

## 2018-07-29 LAB — CBC WITH DIFFERENTIAL/PLATELET
Abs Immature Granulocytes: 0.01 10*3/uL (ref 0.00–0.07)
Basophils Absolute: 0 10*3/uL (ref 0.0–0.1)
Basophils Relative: 0 %
Eosinophils Absolute: 0 10*3/uL (ref 0.0–0.5)
Eosinophils Relative: 1 %
HCT: 40.8 % (ref 36.0–46.0)
Hemoglobin: 14.7 g/dL (ref 12.0–15.0)
Immature Granulocytes: 0 %
Lymphocytes Relative: 28 %
Lymphs Abs: 1.3 10*3/uL (ref 0.7–4.0)
MCH: 42.6 pg — ABNORMAL HIGH (ref 26.0–34.0)
MCHC: 36 g/dL (ref 30.0–36.0)
MCV: 118.3 fL — ABNORMAL HIGH (ref 80.0–100.0)
Monocytes Absolute: 0.3 10*3/uL (ref 0.1–1.0)
Monocytes Relative: 7 %
Neutro Abs: 2.9 10*3/uL (ref 1.7–7.7)
Neutrophils Relative %: 64 %
Platelets: 155 10*3/uL (ref 150–400)
RBC: 3.45 MIL/uL — ABNORMAL LOW (ref 3.87–5.11)
RDW: 11.5 % (ref 11.5–15.5)
WBC: 4.5 10*3/uL (ref 4.0–10.5)
nRBC: 0 % (ref 0.0–0.2)

## 2018-07-31 ENCOUNTER — Encounter: Payer: Self-pay | Admitting: Hematology and Oncology

## 2018-09-23 ENCOUNTER — Inpatient Hospital Stay: Payer: 59 | Attending: Hematology and Oncology

## 2018-09-23 ENCOUNTER — Other Ambulatory Visit: Payer: Self-pay

## 2018-09-23 DIAGNOSIS — D696 Thrombocytopenia, unspecified: Secondary | ICD-10-CM | POA: Diagnosis not present

## 2018-09-23 DIAGNOSIS — Z87891 Personal history of nicotine dependence: Secondary | ICD-10-CM | POA: Diagnosis not present

## 2018-09-23 DIAGNOSIS — D45 Polycythemia vera: Secondary | ICD-10-CM | POA: Diagnosis not present

## 2018-09-23 DIAGNOSIS — Z7982 Long term (current) use of aspirin: Secondary | ICD-10-CM | POA: Diagnosis not present

## 2018-09-23 DIAGNOSIS — Z79899 Other long term (current) drug therapy: Secondary | ICD-10-CM | POA: Insufficient documentation

## 2018-09-23 LAB — COMPREHENSIVE METABOLIC PANEL
ALT: 25 U/L (ref 0–44)
AST: 21 U/L (ref 15–41)
Albumin: 4.4 g/dL (ref 3.5–5.0)
Alkaline Phosphatase: 75 U/L (ref 38–126)
Anion gap: 9 (ref 5–15)
BUN: 13 mg/dL (ref 6–20)
CO2: 25 mmol/L (ref 22–32)
Calcium: 9 mg/dL (ref 8.9–10.3)
Chloride: 103 mmol/L (ref 98–111)
Creatinine, Ser: 0.68 mg/dL (ref 0.44–1.00)
GFR calc Af Amer: >60 mL/min (ref >60)
GFR calc non Af Amer: >60 mL/min (ref >60)
Glucose, Bld: 97 mg/dL (ref 70–99)
Potassium: 4 mmol/L (ref 3.5–5.1)
Sodium: 137 mmol/L (ref 135–145)
Total Bilirubin: 0.8 mg/dL (ref 0.3–1.2)
Total Protein: 7.4 g/dL (ref 6.5–8.1)

## 2018-09-23 LAB — CBC WITH DIFFERENTIAL/PLATELET
Abs Immature Granulocytes: 0.01 10*3/uL (ref 0.00–0.07)
Basophils Absolute: 0 10*3/uL (ref 0.0–0.1)
Basophils Relative: 1 %
Eosinophils Absolute: 0 10*3/uL (ref 0.0–0.5)
Eosinophils Relative: 1 %
HCT: 41.7 % (ref 36.0–46.0)
Hemoglobin: 15.1 g/dL — ABNORMAL HIGH (ref 12.0–15.0)
Immature Granulocytes: 0 %
Lymphocytes Relative: 29 %
Lymphs Abs: 1.2 10*3/uL (ref 0.7–4.0)
MCH: 41.9 pg — ABNORMAL HIGH (ref 26.0–34.0)
MCHC: 36.2 g/dL — ABNORMAL HIGH (ref 30.0–36.0)
MCV: 115.8 fL — ABNORMAL HIGH (ref 80.0–100.0)
Monocytes Absolute: 0.4 10*3/uL (ref 0.1–1.0)
Monocytes Relative: 8 %
Neutro Abs: 2.6 10*3/uL (ref 1.7–7.7)
Neutrophils Relative %: 61 %
Platelets: 149 10*3/uL — ABNORMAL LOW (ref 150–400)
RBC: 3.6 MIL/uL — ABNORMAL LOW (ref 3.87–5.11)
RDW: 11.9 % (ref 11.5–15.5)
WBC: 4.2 10*3/uL (ref 4.0–10.5)
nRBC: 0 % (ref 0.0–0.2)

## 2018-10-13 ENCOUNTER — Other Ambulatory Visit: Payer: Self-pay | Admitting: Internal Medicine

## 2018-10-13 DIAGNOSIS — D45 Polycythemia vera: Secondary | ICD-10-CM

## 2018-10-23 ENCOUNTER — Other Ambulatory Visit: Payer: Self-pay

## 2018-10-23 DIAGNOSIS — D45 Polycythemia vera: Secondary | ICD-10-CM

## 2018-10-23 MED ORDER — HYDROXYUREA 500 MG PO CAPS: ORAL_CAPSULE | ORAL | 3 refills | Status: DC

## 2018-12-25 NOTE — Progress Notes (Signed)
[facility]  [address] [place], [place] [address] Phone: [phone]  Fax: [phone]   Clinic Day:  12/30/2018  Referring physician: Kirk Ruths, MD  Chief Complaint: Melissa Patrick is a 58 y.o. 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  HPI: The patient was last seen in the hematology clinic as a new patient on 07/01/2018. At that time, she denied any B symptoms. She denied any early satiety. She noted no new medications or herbal products. Hematocrit 39.3, hemoglobin 14.3, MCV 119.1, platelets 138,000 (goal <= 400,000), WBC 5,000. LDH 163. She continued hydroxyurea 1000 mg Monday - Friday and 500 mg on Saturday and Sunday.   Labs followed: 07/29/2018: Hematocrit 40.8, hemoglobin 14.7, MCV 118.3, platelets 155,000, WBC 4,500 (ANC 2900). 09/23/2018: Hematocrit 41.7, hemoglobin 15.1, MCV 115.8, platelets 149,000, WBC 4,200 (ANC 2600).  During the interim, she has felt "good". She has no complaints today. She continues to take hydroxyurea 1000 mg Monday - Friday and 500 mg on Saturday and Sunday. Her BP is 130/44 in the clinic today. Her repeat BP is 114/77.   Past Medical History:  Diagnosis Date  . PV (polycythemia vera) ([place]) 09/08/2014    Past Surgical History:  Procedure Laterality Date  . APPENDECTOMY    . TONSILLECTOMY      Family History  Problem Relation Age of Onset  . Cancer Mother 48       breast  . Arthritis Mother   . Cancer Sister        skin  . Stroke Maternal Uncle   . Stroke Maternal Grandmother     Social History:  reports that she quit smoking about 8 years ago. She has never used smokeless tobacco. She reports current alcohol use. She reports that she does not use drugs. 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  She lives in [place]. The patient is alone today.  Allergies:  Allergies  Allergen Reactions  . Dextromoramide     Other reaction(s):  Cough  . Penicillin G Hives    Current Medications: Current Outpatient Medications  Medication Sig Dispense Refill  . aspirin 81 MG tablet Take 81 mg by mouth daily.    . hydroxyurea (HYDREA) 500 MG capsule TAKE 2 CAPSULES BY MOUTH MONDAY- FRIDAY, THEN 1 CAPSULE BY MOUTH SATURDAY AND SUNDAY. 60 capsule 3   No current facility-administered medications for this visit.     Review of Systems  Constitutional: Negative.  Negative for chills, diaphoresis, fever, malaise/fatigue and weight loss (up 6 pounds).       Feels "good".  HENT: Negative.  Negative for congestion, ear discharge, ear pain, hearing loss, nosebleeds, sinus pain and sore throat.   Eyes: Negative for blurred vision and double vision.       Floaters in right eye.  Respiratory: Negative.  Negative for cough, hemoptysis and shortness of breath.   Cardiovascular: Negative.  Negative for chest pain, palpitations and leg swelling.  Gastrointestinal: Negative for abdominal pain, blood in stool, constipation, diarrhea, melena, nausea and vomiting.       Colonoscopy at age 16.  Genitourinary: Negative.  Negative for dysuria, frequency, hematuria and urgency.  Musculoskeletal: Negative.  Negative for back pain, joint pain, myalgias and neck pain.  Skin: Negative.  Negative for itching and rash.  Neurological: Negative for dizziness, tingling, sensory change, speech change, focal weakness, weakness and headaches.       Sometimes has "funny feeling in right  thigh" with walking (improving).  Endo/Heme/Allergies: Negative.  Does not bruise/bleed easily.  Psychiatric/Behavioral: Negative.  Negative for depression and memory loss. The patient is not nervous/anxious and does not have insomnia.   All other systems reviewed and are negative.  Performance status (ECOG): 0  Vitals Blood pressure (!) 130/44, pulse 73, temperature (!) 96.5 F (35.8 C), temperature source Tympanic, resp. rate 18, height 5\' 6"  (1.676 m), weight 220 lb 14.4 oz  (100.2 kg), SpO2 100 %.   Physical Exam  Constitutional: She is oriented to person, place, and time. She appears well-developed and well-nourished. No distress.  HENT:  Head: Normocephalic and atraumatic.  Mouth/Throat: Oropharynx is clear and moist. No oropharyngeal exudate.  Shoulder length brown hair with graying.  Mask.  Eyes: Pupils are equal, round, and reactive to light. Conjunctivae and EOM are normal. No scleral icterus.  Glasses. Blue eyes.  Neck: Normal range of motion. Neck supple. No JVD present.  Cardiovascular: Normal rate and regular rhythm.  No murmur heard. Pulmonary/Chest: Effort normal and breath sounds normal. No respiratory distress. She has no wheezes. She has no rales. She exhibits no tenderness.  Abdominal: Soft. Bowel sounds are normal. She exhibits no mass. There is no abdominal tenderness. There is no rebound and no guarding.  Musculoskeletal: Normal range of motion.        General: No tenderness or edema.  Lymphadenopathy:    She has no cervical adenopathy.    She has no axillary adenopathy.       Right: No supraclavicular adenopathy present.       Left: No supraclavicular adenopathy present.  Neurological: She is alert and oriented to person, place, and time.  Skin: Skin is warm and dry. She is not diaphoretic.  Psychiatric: She has a normal mood and affect. Her behavior is normal. Judgment and thought content normal.  Nursing note and vitals reviewed.   Appointment on 12/30/2018  Component Date Value Ref Range Status  . WBC 12/30/2018 4.3  4.0 - 10.5 K/uL Final  . RBC 12/30/2018 3.45* 3.87 - 5.11 MIL/uL Final  . Hemoglobin 12/30/2018 14.2  12.0 - 15.0 g/dL Final  . HCT 12/30/2018 39.2  36.0 - 46.0 % Final  . MCV 12/30/2018 113.6* 80.0 - 100.0 fL Final  . MCH 12/30/2018 41.2* 26.0 - 34.0 pg Final  . MCHC 12/30/2018 36.2* 30.0 - 36.0 g/dL Final  . RDW 12/30/2018 11.9  11.5 - 15.5 % Final  . Platelets 12/30/2018 157  150 - 400 K/uL Final  . nRBC  12/30/2018 0.0  0.0 - 0.2 % Final  . Neutrophils Relative % 12/30/2018 63  % Final  . Neutro Abs 12/30/2018 2.7  1.7 - 7.7 K/uL Final  . Lymphocytes Relative 12/30/2018 28  % Final  . Lymphs Abs 12/30/2018 1.2  0.7 - 4.0 K/uL Final  . Monocytes Relative 12/30/2018 8  % Final  . Monocytes Absolute 12/30/2018 0.3  0.1 - 1.0 K/uL Final  . Eosinophils Relative 12/30/2018 1  % Final  . Eosinophils Absolute 12/30/2018 0.0  0.0 - 0.5 K/uL Final  . Basophils Relative 12/30/2018 0  % Final  . Basophils Absolute 12/30/2018 0.0  0.0 - 0.1 K/uL Final  . Immature Granulocytes 12/30/2018 0  % Final  . Abs Immature Granulocytes 12/30/2018 0.01  0.00 - 0.07 K/uL Final   Performed at [facility], [address] [place], [place] [address]  . Sodium 12/30/2018 136  135 - 145 mmol/L Final  . Potassium 12/30/2018  4.0  3.5 - 5.1 mmol/L Final  . Chloride 12/30/2018 101  98 - 111 mmol/L Final  . CO2 12/30/2018 26  22 - 32 mmol/L Final  . Glucose, Bld 12/30/2018 95  70 - 99 mg/dL Final  . BUN 12/30/2018 12  6 - 20 mg/dL Final  . Creatinine, Ser 12/30/2018 0.67  0.44 - 1.00 mg/dL Final  . Calcium 12/30/2018 9.0  8.9 - 10.3 mg/dL Final  . Total Protein 12/30/2018 7.1  6.5 - 8.1 g/dL Final  . Albumin 12/30/2018 4.1  3.5 - 5.0 g/dL Final  . AST 12/30/2018 18  15 - 41 U/L Final  . ALT 12/30/2018 26  0 - 44 U/L Final  . Alkaline Phosphatase 12/30/2018 75  38 - 126 U/L Final  . Total Bilirubin 12/30/2018 0.8  0.3 - 1.2 mg/dL Final  . GFR calc non Af Amer 12/30/2018 >60  >60 mL/min Final  . GFR calc Af Amer 12/30/2018 >60  >60 mL/min Final  . Anion gap 12/30/2018 9  5 - 15 Final   Performed at [facility], [address], [place], [place] [address]  . LDH 12/30/2018 163  98 - 192 U/L Final   Performed at [facility], [address], [place], [place] [address]    Assessment:  Melissa Patrick is a 58 y.o. female with polycythemia rubra vera initially diagnosed in  2011.  She presented with a black right second toe.  Counts were elevated.  Work-up on 12/23/2009 was +  V617F mutation.  Epo level was 2.5 (low).  LAP score was 77.  BCR-ABL was negative.  Carbon monoxide level was 0.9%.  CLL profile showed a monoclonal B cell population.  Abdominal ultrasound on 12/27/2009 revealed splenomegaly (20.73 cm).  Bone marrow on 12/29/2009 revealed a hypercellular marrow with trilineage hematopoiesis.  There was no malignancy or dysplasia. Cytogenetics were normal (35, XX).  She began a phlebotomy program.  She undergoes phlebotomy if her hematocrit is > 42.   Last phlebotomy was on 01/02/2016.  She is on hydroxyurea 1000 mg on Monday-Friday, and 500 mg on Saturday and Sunday.  She is on a baby aspirin/day.  She has a family history of breast cancer (mother), gastric cancer (grandmother), and skin cancer (sister).  She has not had a mammogram since 06/25/2014.  She had a colonoscopy at age 5.   Symptomatically, she is doing well.  Exam reveals no adenopathy or hepatosplenomegaly.  Plan: 1.   Labs today: CBC with diff, CMP, LDH. 2.   Polycythemia rubra vera             Hematocrit 39.2.  Hemoglobin 14.2.  MCV 113.6.  Platelets 157,000.  WBC 4300 ([place] 2700).             Hematocrit goal <= 42.  Platelet goal <= 400,000.             She has not required a phlebotomy since 12/2015.             Continue hydroxyurea 1000 mg Monday - Friday and 500 mg on Saturdays and Sundays.             Continue aspirin 81 mg a day. 3.   Health maintenance             Patient has not had a mammogram since 06/2014.             Follow-up with Dr. Ouida Sills. 4.   RTC in 3  months for labs (CBC with diff, CMP). 5.   RTC in 6 months for MD assessment and labs (CBC with diff, CMP, LDH).    I discussed the assessment and treatment plan with the patient.  The patient was provided an opportunity to ask questions and all were answered.  The patient agreed with the plan and demonstrated  an understanding of the instructions.  The patient was advised to call back if the symptoms worsen or if the condition fails to improve as anticipated.   Lequita Asal, MD, PhD    12/30/2018, 9:55 AM  I, Selena Batten, am acting as scribe for [employer]. Mike Gip, MD, PhD.  I, Melissa C. Mike Gip, MD, have reviewed the above documentation for accuracy and completeness, and I agree with the above.

## 2018-12-30 ENCOUNTER — Inpatient Hospital Stay: Payer: 59 | Attending: Hematology and Oncology | Admitting: Hematology and Oncology

## 2018-12-30 ENCOUNTER — Inpatient Hospital Stay: Payer: 59

## 2018-12-30 ENCOUNTER — Other Ambulatory Visit: Payer: Self-pay

## 2018-12-30 ENCOUNTER — Encounter: Payer: Self-pay | Admitting: Hematology and Oncology

## 2018-12-30 VITALS — BP 114/73 | HR 73 | Temp 96.5°F | Resp 18 | Ht 66.0 in | Wt 220.9 lb

## 2018-12-30 DIAGNOSIS — Z7982 Long term (current) use of aspirin: Secondary | ICD-10-CM | POA: Diagnosis not present

## 2018-12-30 DIAGNOSIS — Z79899 Other long term (current) drug therapy: Secondary | ICD-10-CM | POA: Insufficient documentation

## 2018-12-30 DIAGNOSIS — Z803 Family history of malignant neoplasm of breast: Secondary | ICD-10-CM | POA: Insufficient documentation

## 2018-12-30 DIAGNOSIS — Z87891 Personal history of nicotine dependence: Secondary | ICD-10-CM | POA: Diagnosis not present

## 2018-12-30 DIAGNOSIS — D45 Polycythemia vera: Secondary | ICD-10-CM | POA: Insufficient documentation

## 2018-12-30 LAB — CBC WITH DIFFERENTIAL/PLATELET
Abs Immature Granulocytes: 0.01 10*3/uL (ref 0.00–0.07)
Basophils Absolute: 0 10*3/uL (ref 0.0–0.1)
Basophils Relative: 0 %
Eosinophils Absolute: 0 10*3/uL (ref 0.0–0.5)
Eosinophils Relative: 1 %
HCT: 39.2 % (ref 36.0–46.0)
Hemoglobin: 14.2 g/dL (ref 12.0–15.0)
Immature Granulocytes: 0 %
Lymphocytes Relative: 28 %
Lymphs Abs: 1.2 10*3/uL (ref 0.7–4.0)
MCH: 41.2 pg — ABNORMAL HIGH (ref 26.0–34.0)
MCHC: 36.2 g/dL — ABNORMAL HIGH (ref 30.0–36.0)
MCV: 113.6 fL — ABNORMAL HIGH (ref 80.0–100.0)
Monocytes Absolute: 0.3 10*3/uL (ref 0.1–1.0)
Monocytes Relative: 8 %
Neutro Abs: 2.7 10*3/uL (ref 1.7–7.7)
Neutrophils Relative %: 63 %
Platelets: 157 10*3/uL (ref 150–400)
RBC: 3.45 MIL/uL — ABNORMAL LOW (ref 3.87–5.11)
RDW: 11.9 % (ref 11.5–15.5)
WBC: 4.3 10*3/uL (ref 4.0–10.5)
nRBC: 0 % (ref 0.0–0.2)

## 2018-12-30 LAB — COMPREHENSIVE METABOLIC PANEL
ALT: 26 U/L (ref 0–44)
AST: 18 U/L (ref 15–41)
Albumin: 4.1 g/dL (ref 3.5–5.0)
Alkaline Phosphatase: 75 U/L (ref 38–126)
Anion gap: 9 (ref 5–15)
BUN: 12 mg/dL (ref 6–20)
CO2: 26 mmol/L (ref 22–32)
Calcium: 9 mg/dL (ref 8.9–10.3)
Chloride: 101 mmol/L (ref 98–111)
Creatinine, Ser: 0.67 mg/dL (ref 0.44–1.00)
GFR calc Af Amer: >60 mL/min (ref >60)
GFR calc non Af Amer: >60 mL/min (ref >60)
Glucose, Bld: 95 mg/dL (ref 70–99)
Potassium: 4 mmol/L (ref 3.5–5.1)
Sodium: 136 mmol/L (ref 135–145)
Total Bilirubin: 0.8 mg/dL (ref 0.3–1.2)
Total Protein: 7.1 g/dL (ref 6.5–8.1)

## 2018-12-30 LAB — LACTATE DEHYDROGENASE: LDH: 163 U/L (ref 98–192)

## 2018-12-30 NOTE — Progress Notes (Signed)
No new changes noted today 

## 2019-03-25 ENCOUNTER — Other Ambulatory Visit: Payer: Self-pay | Admitting: Hematology and Oncology

## 2019-03-25 DIAGNOSIS — D45 Polycythemia vera: Secondary | ICD-10-CM

## 2019-03-25 NOTE — Telephone Encounter (Signed)
CBC with Differential/Platelet Order: [id number] Status:  Final result  Visible to patient:  Yes (MyChart)  Next appt:  03/31/2019 at 09:00 AM in Oncology ([facility])  Dx:  PV (polycythemia vera) ([facility])  Ref Range & Units 47mo ago  WBC 4.0 - 10.5 K/uL 4.3   RBC 3.87 - 5.11 MIL/uL 3.45Low    Hemoglobin 12.0 - 15.0 g/dL 14.2   HCT 36.0 - 46.0 % 39.2   MCV 80.0 - 100.0 fL 113.6High    MCH 26.0 - 34.0 pg 41.2High    MCHC 30.0 - 36.0 g/dL 36.2High    RDW 11.5 - 15.5 % 11.9   Platelets 150 - 400 K/uL 157   nRBC 0.0 - 0.2 % 0.0   Neutrophils Relative % % 63   Neutro Abs 1.7 - 7.7 K/uL 2.7   Lymphocytes Relative % 28   Lymphs Abs 0.7 - 4.0 K/uL 1.2   Monocytes Relative % 8   Monocytes Absolute 0.1 - 1.0 K/uL 0.3   Eosinophils Relative % 1   Eosinophils Absolute 0.0 - 0.5 K/uL 0.0   Basophils Relative % 0   Basophils Absolute 0.0 - 0.1 K/uL 0.0   Immature Granulocytes % 0   Abs Immature Granulocytes 0.00 - 0.07 K/uL 0.01   Comment: Performed at [facility], [address] [place], [place] [address]  Resulting Agency  [facility]      Specimen Collected: 12/30/18 09:18  Last Resulted: 12/30/18 09:42     Lab Flowsheet   Order Details   View Encounter   Lab and Collection Details   Routing   Result History         Other Results from 12/30/2018  Comprehensive metabolic panel Order: [id number]  Status:  Final result  Visible to patient:  Yes (MyChart)  Next appt:  03/31/2019 at 09:00 AM in Oncology ([facility])  Dx:  PV (polycythemia vera) ([facility])  Ref Range & Units 23mo ago  Sodium 135 - 145 mmol/L 136   Potassium 3.5 - 5.1 mmol/L 4.0   Chloride 98 - 111 mmol/L 101   CO2 22 - 32 mmol/L 26   Glucose, Bld 70 - 99 mg/dL 95   BUN 6 - 20 mg/dL 12   Creatinine, Ser 0.44 - 1.00 mg/dL 0.67   Calcium 8.9 - 10.3 mg/dL 9.0   Total Protein 6.5 - 8.1 g/dL 7.1   Albumin 3.5 - 5.0 g/dL 4.1   AST 15 - 41 U/L 18   ALT 0 - 44 U/L 26   Alkaline Phosphatase 38 -  126 U/L 75   Total Bilirubin 0.3 - 1.2 mg/dL 0.8   GFR calc non Af Amer >60 mL/min >60   GFR calc Af Amer >60 mL/min >60   Anion gap 5 - 15 9   Comment: Performed at [facility], [address], [place], [place] [address]  Resulting Agency  [facility]      Specimen Collected: 12/30/18 09:18  Last Resulted: 12/30/18 09:41

## 2019-03-30 ENCOUNTER — Other Ambulatory Visit: Payer: Self-pay

## 2019-03-31 ENCOUNTER — Other Ambulatory Visit: Payer: Self-pay

## 2019-03-31 ENCOUNTER — Inpatient Hospital Stay: Payer: 59 | Attending: Oncology

## 2019-03-31 DIAGNOSIS — D696 Thrombocytopenia, unspecified: Secondary | ICD-10-CM | POA: Insufficient documentation

## 2019-03-31 DIAGNOSIS — D45 Polycythemia vera: Secondary | ICD-10-CM | POA: Insufficient documentation

## 2019-03-31 LAB — COMPREHENSIVE METABOLIC PANEL
ALT: 23 U/L (ref 0–44)
AST: 21 U/L (ref 15–41)
Albumin: 4.3 g/dL (ref 3.5–5.0)
Alkaline Phosphatase: 77 U/L (ref 38–126)
Anion gap: 7 (ref 5–15)
BUN: 13 mg/dL (ref 6–20)
CO2: 26 mmol/L (ref 22–32)
Calcium: 9 mg/dL (ref 8.9–10.3)
Chloride: 105 mmol/L (ref 98–111)
Creatinine, Ser: 0.64 mg/dL (ref 0.44–1.00)
GFR calc Af Amer: >60 mL/min (ref >60)
GFR calc non Af Amer: >60 mL/min (ref >60)
Glucose, Bld: 91 mg/dL (ref 70–99)
Potassium: 4.3 mmol/L (ref 3.5–5.1)
Sodium: 138 mmol/L (ref 135–145)
Total Bilirubin: 0.5 mg/dL (ref 0.3–1.2)
Total Protein: 7.3 g/dL (ref 6.5–8.1)

## 2019-03-31 LAB — CBC WITH DIFFERENTIAL/PLATELET
Abs Immature Granulocytes: 0.01 10*3/uL (ref 0.00–0.07)
Basophils Absolute: 0 10*3/uL (ref 0.0–0.1)
Basophils Relative: 1 %
Eosinophils Absolute: 0 10*3/uL (ref 0.0–0.5)
Eosinophils Relative: 1 %
HCT: 38.6 % (ref 36.0–46.0)
Hemoglobin: 14.2 g/dL (ref 12.0–15.0)
Immature Granulocytes: 0 %
Lymphocytes Relative: 32 %
Lymphs Abs: 1.3 10*3/uL (ref 0.7–4.0)
MCH: 42.3 pg — ABNORMAL HIGH (ref 26.0–34.0)
MCHC: 36.8 g/dL — ABNORMAL HIGH (ref 30.0–36.0)
MCV: 114.9 fL — ABNORMAL HIGH (ref 80.0–100.0)
Monocytes Absolute: 0.3 10*3/uL (ref 0.1–1.0)
Monocytes Relative: 8 %
Neutro Abs: 2.4 10*3/uL (ref 1.7–7.7)
Neutrophils Relative %: 58 %
Platelets: 154 10*3/uL (ref 150–400)
RBC: 3.36 MIL/uL — ABNORMAL LOW (ref 3.87–5.11)
RDW: 12.1 % (ref 11.5–15.5)
WBC: 4.1 10*3/uL (ref 4.0–10.5)
nRBC: 0 % (ref 0.0–0.2)

## 2019-03-31 LAB — URIC ACID: Uric Acid, Serum: 4 mg/dL (ref 2.5–7.1)

## 2019-03-31 LAB — LACTATE DEHYDROGENASE: LDH: 182 U/L (ref 98–192)

## 2019-06-13 ENCOUNTER — Ambulatory Visit: Payer: 59 | Attending: Internal Medicine

## 2019-06-13 DIAGNOSIS — Z23 Encounter for immunization: Secondary | ICD-10-CM | POA: Insufficient documentation

## 2019-06-13 NOTE — Progress Notes (Signed)
   Covid-19 Vaccination Clinic  Name:  Amarachukwu Arington    MRN: [id number] DOB: 07-23-1960  06/13/2019  Ms. Casten was observed post Covid-19 immunization for 15 minutes without incidence. She was provided with Vaccine Information Sheet and instruction to access the V-Safe system.   Ms. Rosencrantz was instructed to call 911 with any severe reactions post vaccine: Marland Kitchen Difficulty breathing  . Swelling of your face and throat  . A fast heartbeat  . A bad rash all over your body  . Dizziness and weakness    Immunizations Administered    Name Date Dose VIS Date Route   Moderna COVID-19 Vaccine 06/13/2019 10:11 AM 0.5 mL 03/17/2019 Intramuscular   Manufacturer: Moderna   Lot: [id number]   [id number]

## 2019-06-26 ENCOUNTER — Other Ambulatory Visit: Payer: Self-pay

## 2019-06-26 DIAGNOSIS — D45 Polycythemia vera: Secondary | ICD-10-CM

## 2019-06-26 DIAGNOSIS — D7589 Other specified diseases of blood and blood-forming organs: Secondary | ICD-10-CM

## 2019-06-27 NOTE — Progress Notes (Signed)
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   HPI: The patient was last seen in the hematology clinic on 12/30/2018. At that time, she was doing well. Exam revealed no adenopathy or hepatosplenomegaly. Hematocrit was 39.2, hemoglobin 14.2, platelets 157,000, WBC 4,300. CMP was normal. LDH was 163. Patient remained on aspirin 81 mg a day. She continued hydroxyurea 1,000 mg Monday through Friday and 500 mg on Saturday and Sunday.    Labs on 03/31/2019 included hematocrit 38.6, hemoglobin 14.2, MCV 114.9, platelets 154,000, and WBC 4,100. CMP was normal.  LDH was 182 and uric acid 4.0.   During the interim, she has been feeling "good". She continues hydroxyurea 1,000 mg Monday through Friday and 500 mg on Saturday and Sunday. She reports tolerating the medication well.   She is being seen by ophthalmologist at [facility] once a year for the floaters in the right eye. Patient bilateral thighs burn when standing. Reports no other symptoms.   Patient had her first COVID-19 vaccine at the beginning of 06/2019. She had no symptoms. She will receive the second dose on 07/14/2019.    Past Medical History:  Diagnosis Date  . PV (polycythemia vera) ([place]) 09/08/2014    Past Surgical History:  Procedure Laterality Date  . APPENDECTOMY    . TONSILLECTOMY      Family History  Problem Relation Age of Onset  . Cancer Mother 15       breast  . Arthritis Mother   . Cancer Sister        skin  . Stroke Maternal Uncle   . Stroke Maternal Grandmother     Social History:  reports that she quit smoking about 9 years ago. She has never used smokeless tobacco. She reports current alcohol use. She reports that she does not use  drugs. 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 She lives in [place]. The patient is alone today.  Allergies:  Allergies  Allergen Reactions  . Dextromoramide     Other reaction(s): Cough  . Penicillin G Hives    Current Medications: Current Outpatient Medications  Medication Sig Dispense Refill  . aspirin 81 MG tablet Take 81 mg by mouth daily.    . Cholecalciferol 50 MCG (2000 UT) TABS Take 2,000 Units by mouth daily.     . hydroxyurea (HYDREA) 500 MG capsule TAKE 2 CAPSULES BY MOUTH MONDAY THROUGH FRIDAY, THEN 1 CAPSULE SATURDAY AND SUNDAY 60 capsule 3   No current facility-administered medications for this visit.    Review of Systems  Constitutional: Negative for chills, diaphoresis, fever, malaise/fatigue and weight loss (up 2 lbs).       Doing "good".  HENT: Negative.  Negative for congestion, ear discharge, ear pain, hearing loss, nosebleeds, sinus pain and sore throat.   Eyes: Negative for blurred vision and double vision.       Floaters in right eye.  Respiratory: Negative.  Negative for cough, hemoptysis and shortness of breath.   Cardiovascular: Negative.  Negative for chest pain, palpitations and leg swelling.  Gastrointestinal: Negative for abdominal pain, blood in stool, constipation, diarrhea, melena, nausea and vomiting.       Colonoscopy at age 65.  Genitourinary: Negative.  Negative for dysuria, frequency,  hematuria and urgency.  Musculoskeletal: Negative.  Negative for back pain, joint pain, myalgias and neck pain.  Skin: Negative.  Negative for itching and rash.  Neurological: Positive for sensory change (occasional bilateral thighs burn when standing). Negative for dizziness, tingling, speech change, focal weakness, weakness and headaches.  Endo/Heme/Allergies: Negative.  Does not bruise/bleed easily.  Psychiatric/Behavioral: Negative.  Negative for depression and memory loss. The patient is not nervous/anxious and does not have insomnia.   All  other systems reviewed and are negative.  Performance status (ECOG): 0  Vitals Blood pressure 120/71, pulse 65, temperature (!) 97.5 F (36.4 C), temperature source Tympanic, resp. rate 16, weight 222 lb 0.1 oz (100.7 kg), SpO2 100 %.   Physical Exam Vitals and nursing note reviewed.  Constitutional:      General: She is not in acute distress.    Appearance: She is well-developed and well-nourished. She is not diaphoretic.  HENT:     Head: Normocephalic and atraumatic.     Mouth/Throat:     Mouth: Oropharynx is clear and moist.     Pharynx: No oropharyngeal exudate.      Comments: Short styled graying hair.  Mask. Eyes:     General: No scleral icterus.    Extraocular Movements: EOM normal.     Conjunctiva/sclera: Conjunctivae normal.     Pupils: Pupils are equal, round, and reactive to light.     Comments: Glasses. Blue eyes.  Neck:     Vascular: No JVD.  Cardiovascular:     Rate and Rhythm: Normal rate and regular rhythm.     Heart sounds: No murmur heard.   Pulmonary:     Effort: Pulmonary effort is normal. No respiratory distress.     Breath sounds: Normal breath sounds. No wheezing or rales.  Chest:     Chest wall: No tenderness.  Abdominal:     General: Bowel sounds are normal.     Palpations: Abdomen is soft. There is no mass.     Tenderness: There is no abdominal tenderness. There is no guarding or rebound.  Musculoskeletal:        General: No tenderness or edema. Normal range of motion.     Cervical back: Normal range of motion and neck supple.  Lymphadenopathy:     Cervical: No cervical adenopathy.     Upper Body:  No axillary adenopathy present.    Right upper body: No supraclavicular adenopathy.     Left upper body: No supraclavicular adenopathy.  Skin:    General: Skin is warm and dry.  Neurological:     Mental Status: She is alert and oriented to person, place, and time.  Psychiatric:        Mood and Affect: Mood and affect normal.        Behavior:  Behavior normal.        Thought Content: Thought content normal.        Judgment: Judgment normal.     Appointment on 06/30/2019  Component Date Value Ref Range Status  . WBC 06/30/2019 3.5* 4.0 - 10.5 K/uL Final  . RBC 06/30/2019 3.33* 3.87 - 5.11 MIL/uL Final  . Hemoglobin 06/30/2019 14.1  12.0 - 15.0 g/dL Final  . HCT 06/30/2019 39.1  36.0 - 46.0 % Final  . MCV 06/30/2019 117.4* 80.0 - 100.0 fL Final  . MCH 06/30/2019 42.3* 26.0 - 34.0 pg Final  . MCHC 06/30/2019 36.1* 30.0 - 36.0 g/dL Final  . RDW 06/30/2019 12.1  11.5 - 15.5 %  Final  . Platelets 06/30/2019 136* 150 - 400 K/uL Final  . nRBC 06/30/2019 0.0  0.0 - 0.2 % Final  . Neutrophils Relative % 06/30/2019 61  % Final  . Neutro Abs 06/30/2019 2.1  1.7 - 7.7 K/uL Final  . Lymphocytes Relative 06/30/2019 29  % Final  . Lymphs Abs 06/30/2019 1.0  0.7 - 4.0 K/uL Final  . Monocytes Relative 06/30/2019 9  % Final  . Monocytes Absolute 06/30/2019 0.3  0.1 - 1.0 K/uL Final  . Eosinophils Relative 06/30/2019 1  % Final  . Eosinophils Absolute 06/30/2019 0.0  0.0 - 0.5 K/uL Final  . Basophils Relative 06/30/2019 0  % Final  . Basophils Absolute 06/30/2019 0.0  0.0 - 0.1 K/uL Final  . Immature Granulocytes 06/30/2019 0  % Final  . Abs Immature Granulocytes 06/30/2019 0.01  0.00 - 0.07 K/uL Final   Performed at [facility], [address] [place], [place] [address]  . Sodium 06/30/2019 138  135 - 145 mmol/L Final  . Potassium 06/30/2019 4.4  3.5 - 5.1 mmol/L Final  . Chloride 06/30/2019 103  98 - 111 mmol/L Final  . CO2 06/30/2019 31  22 - 32 mmol/L Final  . Glucose, Bld 06/30/2019 105* 70 - 99 mg/dL Final   Glucose reference range applies only to samples taken after fasting for at least 8 hours.  . BUN 06/30/2019 13  6 - 20 mg/dL Final  . Creatinine, Ser 06/30/2019 0.74  0.44 - 1.00 mg/dL Final  . Calcium 06/30/2019 9.1  8.9 - 10.3 mg/dL Final  . Total Protein 06/30/2019 6.8  6.5 - 8.1 g/dL Final  . Albumin  06/30/2019 4.1  3.5 - 5.0 g/dL Final  . AST 06/30/2019 18  15 - 41 U/L Final  . ALT 06/30/2019 23  0 - 44 U/L Final  . Alkaline Phosphatase 06/30/2019 72  38 - 126 U/L Final  . Total Bilirubin 06/30/2019 0.5  0.3 - 1.2 mg/dL Final  . GFR calc non Af Amer 06/30/2019 >60  >60 mL/min Final  . GFR calc Af Amer 06/30/2019 >60  >60 mL/min Final  . Anion gap 06/30/2019 4* 5 - 15 Final   Performed at [facility], [address], [place], [place] [address]    Assessment:  Melissa Patrick is a 59 y.o. female with polycythemia rubra verainitially diagnosed in 2011. She presented with a black right second toe. Counts were elevated.  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 Epo level was 2.5 (low). LAP score was 77. BCR-ABL was negative. Carbon monoxide level was 0.9%. CLL profile showed a monoclonal B cell population. Abdominal ultrasound on 12/27/2009 revealed splenomegaly(20.73 cm).  Bone marrowon 12/29/2009 revealed a hypercellular marrow with trilineage hematopoiesis.  There was no malignancy or dysplasia. Cytogenetics were normal (46,XX).  She began a phlebotomy program. She undergoes phlebotomy if her hematocrit is >42. Last phlebotomy was on 01/02/2016.  She is on hydroxyurea1000 mg onMonday-Friday, and 500 mgon Saturday and Sunday. She is on a baby aspirin/day.  She has afamily historyof breast cancer (mother), gastric cancer (grandmother), and skin cancer (sister). She has not had a mammogramsince 06/25/2014. She had a colonoscopyat age 9.   Patient received her first COVID-19 vaccine at the beginning of 06/2019. She had no symptoms. She will receive the second dose on 07/14/2019.   Symptomatically, she feels "good".  She denies any B symptoms.  Exam reveals no adenopathy or hepatosplenomegaly.  Plan: 1.   Labs today:  CBC with diff, CMP, B12, folate, TSH. 2.Polycythemia rubra vera Hematocrit 39.1.  Hemoglobin 14.1.   MCV 117.4.  Platelets 136,000.  WBC 3500 ([place] 2100). Hematocrit goal <= 42. Platelet goal <= 400,000. She has not required a phlebotomy since 12/2015. Continue hydroxyurea 1000 mg on Monday - Friday and 500 mg on Saturdays and Sundays   Continue a baby aspirin a day  3.   Macrocytosis  Etiology secondary to hydroxyurea.  Check B12, folate and TSH today and annually. 4. Health maintenance Last mammogram on 06/25/2014.  Discuss follow-up with Dr. Ouida Sills. 5.   RTC in 3 months for labs (CBC with diff, CMP).   6.   RTC in 6 months for MD assessment and labs (CBC with diff, CMP, LDH).    I discussed the assessment and treatment plan with the patient.  The patient was provided an opportunity to ask questions and all were answered.  The patient agreed with the plan and demonstrated an understanding of the instructions.  The patient was advised to call back if the symptoms worsen or if the condition fails to improve as anticipated.   Lequita Asal, MD, PhD    06/30/2019, 9:41 AM  I, Selena Batten, am acting as scribe for [employer]. Mike Gip, MD, PhD.  I, Raunak Antuna C. Mike Gip, MD, have reviewed the above documentation for accuracy and completeness, and I agree with the above.

## 2019-06-29 ENCOUNTER — Other Ambulatory Visit: Payer: Self-pay

## 2019-06-29 NOTE — Progress Notes (Signed)
Confirmed Name and DOB. Denies any concerns.  

## 2019-06-30 ENCOUNTER — Telehealth: Payer: Self-pay

## 2019-06-30 ENCOUNTER — Other Ambulatory Visit: Payer: Self-pay | Admitting: Hematology and Oncology

## 2019-06-30 ENCOUNTER — Inpatient Hospital Stay: Payer: 59 | Attending: Hematology and Oncology | Admitting: Hematology and Oncology

## 2019-06-30 ENCOUNTER — Encounter: Payer: Self-pay | Admitting: Hematology and Oncology

## 2019-06-30 ENCOUNTER — Inpatient Hospital Stay: Payer: 59

## 2019-06-30 VITALS — BP 120/71 | HR 65 | Temp 97.5°F | Resp 16 | Wt 222.0 lb

## 2019-06-30 DIAGNOSIS — D45 Polycythemia vera: Secondary | ICD-10-CM

## 2019-06-30 DIAGNOSIS — D7589 Other specified diseases of blood and blood-forming organs: Secondary | ICD-10-CM

## 2019-06-30 LAB — COMPREHENSIVE METABOLIC PANEL
ALT: 23 U/L (ref 0–44)
AST: 18 U/L (ref 15–41)
Albumin: 4.1 g/dL (ref 3.5–5.0)
Alkaline Phosphatase: 72 U/L (ref 38–126)
Anion gap: 4 — ABNORMAL LOW (ref 5–15)
BUN: 13 mg/dL (ref 6–20)
CO2: 31 mmol/L (ref 22–32)
Calcium: 9.1 mg/dL (ref 8.9–10.3)
Chloride: 103 mmol/L (ref 98–111)
Creatinine, Ser: 0.74 mg/dL (ref 0.44–1.00)
GFR calc Af Amer: >60 mL/min (ref >60)
GFR calc non Af Amer: >60 mL/min (ref >60)
Glucose, Bld: 105 mg/dL — ABNORMAL HIGH (ref 70–99)
Potassium: 4.4 mmol/L (ref 3.5–5.1)
Sodium: 138 mmol/L (ref 135–145)
Total Bilirubin: 0.5 mg/dL (ref 0.3–1.2)
Total Protein: 6.8 g/dL (ref 6.5–8.1)

## 2019-06-30 LAB — CBC WITH DIFFERENTIAL/PLATELET
Abs Immature Granulocytes: 0.01 10*3/uL (ref 0.00–0.07)
Basophils Absolute: 0 10*3/uL (ref 0.0–0.1)
Basophils Relative: 0 %
Eosinophils Absolute: 0 10*3/uL (ref 0.0–0.5)
Eosinophils Relative: 1 %
HCT: 39.1 % (ref 36.0–46.0)
Hemoglobin: 14.1 g/dL (ref 12.0–15.0)
Immature Granulocytes: 0 %
Lymphocytes Relative: 29 %
Lymphs Abs: 1 10*3/uL (ref 0.7–4.0)
MCH: 42.3 pg — ABNORMAL HIGH (ref 26.0–34.0)
MCHC: 36.1 g/dL — ABNORMAL HIGH (ref 30.0–36.0)
MCV: 117.4 fL — ABNORMAL HIGH (ref 80.0–100.0)
Monocytes Absolute: 0.3 10*3/uL (ref 0.1–1.0)
Monocytes Relative: 9 %
Neutro Abs: 2.1 10*3/uL (ref 1.7–7.7)
Neutrophils Relative %: 61 %
Platelets: 136 10*3/uL — ABNORMAL LOW (ref 150–400)
RBC: 3.33 MIL/uL — ABNORMAL LOW (ref 3.87–5.11)
RDW: 12.1 % (ref 11.5–15.5)
WBC: 3.5 10*3/uL — ABNORMAL LOW (ref 4.0–10.5)
nRBC: 0 % (ref 0.0–0.2)

## 2019-06-30 LAB — VITAMIN B12: Vitamin B-12: 252 pg/mL (ref 180–914)

## 2019-06-30 LAB — FOLATE: Folate: 11.2 ng/mL (ref >5.9)

## 2019-06-30 LAB — TSH: TSH: 2.412 u[IU]/mL (ref 0.350–4.500)

## 2019-06-30 NOTE — Telephone Encounter (Signed)
-----   Message from Lequita Asal, MD sent at 06/30/2019  3:32 PM EDT ----- Regarding: Please call patient  B12 is low.  Begin B12 1000 mcg po q day.  Check B12 level in 1 month.  If B12 remains low, begin B12 injections monthly.  M ----- Message ----- From: Buel Ream, Lab In [place] Sent: 06/30/2019   9:01 AM EDT To: Lequita Asal, MD

## 2019-06-30 NOTE — Telephone Encounter (Signed)
Spoke with the patient to inform her she need to start oral B-12 1000 mcg1 tab po daily and recheck labs in 1 month. The patient was schedule for 07/31/2019 @ 8AM. If levels has not improved Dr Mike Gip would like to consider B-12 injection. The patient was understanding and agreeable.

## 2019-07-11 ENCOUNTER — Ambulatory Visit: Payer: 59 | Attending: Internal Medicine

## 2019-07-11 ENCOUNTER — Ambulatory Visit: Payer: 59

## 2019-07-11 DIAGNOSIS — Z23 Encounter for immunization: Secondary | ICD-10-CM

## 2019-07-11 NOTE — Progress Notes (Signed)
   Covid-19 Vaccination Clinic  Name:  Melissa Patrick    MRN: [id number] DOB: May 06, 1960  07/11/2019  [name] was observed post Covid-19 immunization for 15 minutes without incident. She was provided with Vaccine Information Sheet and instruction to access the V-Safe system.   [name] was instructed to call 911 with any severe reactions post vaccine: Marland Kitchen Difficulty breathing  . Swelling of face and throat  . A fast heartbeat  . A bad rash all over body  . Dizziness and weakness

## 2019-07-14 ENCOUNTER — Ambulatory Visit: Payer: 59

## 2019-07-28 ENCOUNTER — Other Ambulatory Visit: Payer: Self-pay

## 2019-07-28 DIAGNOSIS — D7589 Other specified diseases of blood and blood-forming organs: Secondary | ICD-10-CM

## 2019-07-31 ENCOUNTER — Other Ambulatory Visit: Payer: Self-pay

## 2019-07-31 ENCOUNTER — Inpatient Hospital Stay: Payer: 59 | Attending: Hematology and Oncology

## 2019-07-31 DIAGNOSIS — D7589 Other specified diseases of blood and blood-forming organs: Secondary | ICD-10-CM | POA: Insufficient documentation

## 2019-07-31 LAB — VITAMIN B12: Vitamin B-12: 319 pg/mL (ref 180–914)

## 2019-08-21 ENCOUNTER — Other Ambulatory Visit: Payer: Self-pay | Admitting: Hematology and Oncology

## 2019-08-21 DIAGNOSIS — D45 Polycythemia vera: Secondary | ICD-10-CM

## 2019-09-29 ENCOUNTER — Other Ambulatory Visit: Payer: Self-pay

## 2019-09-29 ENCOUNTER — Inpatient Hospital Stay: Payer: 59 | Attending: Hematology and Oncology

## 2019-09-29 DIAGNOSIS — D45 Polycythemia vera: Secondary | ICD-10-CM | POA: Insufficient documentation

## 2019-09-29 LAB — CBC WITH DIFFERENTIAL/PLATELET
Abs Immature Granulocytes: 0.01 10*3/uL (ref 0.00–0.07)
Basophils Absolute: 0 10*3/uL (ref 0.0–0.1)
Basophils Relative: 1 %
Eosinophils Absolute: 0 10*3/uL (ref 0.0–0.5)
Eosinophils Relative: 1 %
HCT: 38.8 % (ref 36.0–46.0)
Hemoglobin: 14 g/dL (ref 12.0–15.0)
Immature Granulocytes: 0 %
Lymphocytes Relative: 27 %
Lymphs Abs: 1.2 10*3/uL (ref 0.7–4.0)
MCH: 42 pg — ABNORMAL HIGH (ref 26.0–34.0)
MCHC: 36.1 g/dL — ABNORMAL HIGH (ref 30.0–36.0)
MCV: 116.5 fL — ABNORMAL HIGH (ref 80.0–100.0)
Monocytes Absolute: 0.3 10*3/uL (ref 0.1–1.0)
Monocytes Relative: 6 %
Neutro Abs: 2.9 10*3/uL (ref 1.7–7.7)
Neutrophils Relative %: 65 %
Platelets: 187 10*3/uL (ref 150–400)
RBC: 3.33 MIL/uL — ABNORMAL LOW (ref 3.87–5.11)
RDW: 11.7 % (ref 11.5–15.5)
WBC: 4.4 10*3/uL (ref 4.0–10.5)
nRBC: 0 % (ref 0.0–0.2)

## 2019-09-29 LAB — COMPREHENSIVE METABOLIC PANEL
ALT: 24 U/L (ref 0–44)
AST: 21 U/L (ref 15–41)
Albumin: 4.2 g/dL (ref 3.5–5.0)
Alkaline Phosphatase: 69 U/L (ref 38–126)
Anion gap: 7 (ref 5–15)
BUN: 11 mg/dL (ref 6–20)
CO2: 27 mmol/L (ref 22–32)
Calcium: 9 mg/dL (ref 8.9–10.3)
Chloride: 103 mmol/L (ref 98–111)
Creatinine, Ser: 0.77 mg/dL (ref 0.44–1.00)
GFR calc Af Amer: >60 mL/min (ref >60)
GFR calc non Af Amer: >60 mL/min (ref >60)
Glucose, Bld: 103 mg/dL — ABNORMAL HIGH (ref 70–99)
Potassium: 4.2 mmol/L (ref 3.5–5.1)
Sodium: 137 mmol/L (ref 135–145)
Total Bilirubin: 0.6 mg/dL (ref 0.3–1.2)
Total Protein: 6.9 g/dL (ref 6.5–8.1)

## 2019-12-28 ENCOUNTER — Encounter: Payer: Self-pay | Admitting: Hematology and Oncology

## 2019-12-28 NOTE — Progress Notes (Signed)
[facility]  [address] [place], [place] [address] Phone: [phone]  Fax: [phone]   Clinic Day:  12/29/2019  Referring physician: Kirk Ruths, MD  Chief Complaint: Melissa Patrick is a 59 y.o. 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   HPI: The patient was last seen in the hematology clinic on 06/30/2019. At that time, she felt "good".  She denied any B symptoms.  Exam revealed no adenopathy or hepatosplenomegaly. Hematocrit was 39.1, hemoglobin 14.1, platelets 136,000, WBC 3,500 (ANC 2,100). TSH was 2.412. B12 was 252 (low) and folate 11.2. She continued hydroxyurea and baby aspirin.  She was contacted regarding initiation of oral B12 1000 mcg a day.  Labs on 09/29/2019 revealed a hematocrit of 38.8, hemoglobin 14.0, platelets 187,000, WBC 4,400 with an ANC of 2900. CMP was normal.  Vitamin B12 was 319 on 07/31/2019.  During the interim, she has been "good." Since her last visit, she has had two episodes of sharp pains across her head. The most recent episode was last week.  She denies associated nausea, vomiting, numbness, weakness, balance, and coordination problems. She used to have bad migraines. She states it might have to do with her glasses being too tight. She has occasional floaters in her right eye. She has occasional lateral thigh burning when she stands.  She has been taking oral Vitamin B12 about 3x per week because she forgets. She will try to put it daily with her other pills.   Past Medical History:  Diagnosis Date  . PV (polycythemia vera) ([place]) 09/08/2014    Past Surgical History:  Procedure Laterality Date  . APPENDECTOMY    . TONSILLECTOMY      Family History  Problem Relation Age of Onset  . Cancer Mother 94       breast  . Arthritis Mother   . Cancer Sister        skin  . Stroke Maternal Uncle   . Stroke Maternal Grandmother     Social History:  reports  that she quit smoking about 9 years ago. She has never used smokeless tobacco. She reports current alcohol use. She reports that she does not use drugs. 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 The patient is alone today.  Allergies:  Allergies  Allergen Reactions  . Dextromoramide     Other reaction(s): Cough  . Penicillin G Hives    Current Medications: Current Outpatient Medications  Medication Sig Dispense Refill  . aspirin 81 MG tablet Take 81 mg by mouth daily.    . Cholecalciferol 50 MCG (2000 UT) TABS Take 2,000 Units by mouth daily.     . hydroxyurea (HYDREA) 500 MG capsule TAKE 2 CAPSULES BY MOUTH MONDAY THROUGH FRIDAY, THEN 1 CAPSULE SATURDAY AND SUNDAY 60 capsule 3   No current facility-administered medications for this visit.    Review of Systems  Constitutional: Negative.  Negative for chills, diaphoresis, fever, malaise/fatigue and weight loss (stable).       Doing "good".  HENT: Negative.  Negative for congestion, ear discharge, ear pain, hearing loss, nosebleeds, sinus pain, sore throat and tinnitus.   Eyes: Negative for blurred vision.       Occasional floaters in right eye  Respiratory: Negative.  Negative for cough, hemoptysis, sputum production and shortness of breath.   Cardiovascular: Negative.  Negative for chest pain, palpitations and leg swelling.  Gastrointestinal: Negative for abdominal pain, blood  in stool, constipation, diarrhea, heartburn, melena, nausea and vomiting.       Colonoscopy at age 29.  Genitourinary: Negative.  Negative for dysuria, frequency, hematuria and urgency.  Musculoskeletal: Negative.  Negative for back pain, joint pain, myalgias and neck pain.  Skin: Negative.  Negative for itching and rash.  Neurological: Positive for sensory change (occasional lateral thigh burning when standing) and headaches (sharp pain across head, 2 episodes). Negative for dizziness, tingling, speech change, focal weakness  and weakness.  Endo/Heme/Allergies: Negative.  Does not bruise/bleed easily.  Psychiatric/Behavioral: Negative.  Negative for depression and memory loss. The patient is not nervous/anxious and does not have insomnia.   All other systems reviewed and are negative.  Performance status (ECOG): 0  Vitals Blood pressure 125/69, pulse 65, temperature (!) 95.8 F (35.4 C), temperature source Tympanic, resp. rate 18, weight 222 lb 0.1 oz (100.7 kg), SpO2 100 %.   Physical Exam Vitals and nursing note reviewed.  Constitutional:      General: She is not in acute distress.    Appearance: She is well-developed. She is not diaphoretic.  HENT:     Head: Normocephalic and atraumatic.     Comments: Short brown hair.    Mouth/Throat:     Pharynx: No oropharyngeal exudate.  Eyes:     General: No scleral icterus.    Conjunctiva/sclera: Conjunctivae normal.     Pupils: Pupils are equal, round, and reactive to light.     Comments: Glasses. Blue eyes.  Neck:     Vascular: No JVD.  Cardiovascular:     Rate and Rhythm: Normal rate and regular rhythm.     Heart sounds: No murmur heard.   Pulmonary:     Effort: Pulmonary effort is normal. No respiratory distress.     Breath sounds: Normal breath sounds. No wheezing or rales.  Chest:     Chest wall: No tenderness.  Abdominal:     General: Bowel sounds are normal.     Palpations: Abdomen is soft. There is no hepatomegaly, splenomegaly or mass.     Tenderness: There is no abdominal tenderness. There is no guarding or rebound.  Musculoskeletal:        General: No tenderness. Normal range of motion.     Cervical back: Normal range of motion and neck supple.  Lymphadenopathy:     Head:     Right side of head: No preauricular, posterior auricular or occipital adenopathy.     Left side of head: No preauricular, posterior auricular or occipital adenopathy.     Cervical: No cervical adenopathy.     Upper Body:     Right upper body: No supraclavicular  or axillary adenopathy.     Left upper body: No supraclavicular or axillary adenopathy.     Lower Body: No right inguinal adenopathy. No left inguinal adenopathy.  Skin:    General: Skin is warm and dry.  Neurological:     Mental Status: She is alert and oriented to person, place, and time.  Psychiatric:        Behavior: Behavior normal.        Thought Content: Thought content normal.        Judgment: Judgment normal.    Appointment on 12/29/2019  Component Date Value Ref Range Status  . LDH 12/29/2019 194* 98 - 192 U/L Final   Performed at [facility], [address], [place], [place] [address]  . WBC 12/29/2019 4.2  4.0 - 10.5 K/uL Final  .  RBC 12/29/2019 3.47* 3.87 - 5.11 MIL/uL Final  . Hemoglobin 12/29/2019 14.0  12.0 - 15.0 g/dL Final  . HCT 12/29/2019 39.1  36 - 46 % Final  . MCV 12/29/2019 112.7* 80.0 - 100.0 fL Final  . MCH 12/29/2019 40.3* 26.0 - 34.0 pg Final  . MCHC 12/29/2019 35.8  30.0 - 36.0 g/dL Final  . RDW 12/29/2019 11.6  11.5 - 15.5 % Final  . Platelets 12/29/2019 160  150 - 400 K/uL Final  . nRBC 12/29/2019 0.0  0.0 - 0.2 % Final  . Neutrophils Relative % 12/29/2019 63  % Final  . Neutro Abs 12/29/2019 2.7  1.7 - 7.7 K/uL Final  . Lymphocytes Relative 12/29/2019 26  % Final  . Lymphs Abs 12/29/2019 1.1  0.7 - 4.0 K/uL Final  . Monocytes Relative 12/29/2019 8  % Final  . Monocytes Absolute 12/29/2019 0.4  0 - 1 K/uL Final  . Eosinophils Relative 12/29/2019 1  % Final  . Eosinophils Absolute 12/29/2019 0.0  0 - 0 K/uL Final  . Basophils Relative 12/29/2019 1  % Final  . Basophils Absolute 12/29/2019 0.0  0 - 0 K/uL Final  . Immature Granulocytes 12/29/2019 1  % Final  . Abs Immature Granulocytes 12/29/2019 0.02  0.00 - 0.07 K/uL Final   Performed at [facility], [address], [place], [place] [address]  . Sodium 12/29/2019 139  135 - 145 mmol/L Final  . Potassium 12/29/2019 4.1  3.5 - 5.1 mmol/L Final  . Chloride 12/29/2019  103  98 - 111 mmol/L Final  . CO2 12/29/2019 27  22 - 32 mmol/L Final  . Glucose, Bld 12/29/2019 90  70 - 99 mg/dL Final   Glucose reference range applies only to samples taken after fasting for at least 8 hours.  . BUN 12/29/2019 17  6 - 20 mg/dL Final  . Creatinine, Ser 12/29/2019 0.69  0.44 - 1.00 mg/dL Final  . Calcium 12/29/2019 8.7* 8.9 - 10.3 mg/dL Final  . Total Protein 12/29/2019 7.0  6.5 - 8.1 g/dL Final  . Albumin 12/29/2019 4.4  3.5 - 5.0 g/dL Final  . AST 12/29/2019 18  15 - 41 U/L Final  . ALT 12/29/2019 20  0 - 44 U/L Final  . Alkaline Phosphatase 12/29/2019 65  38 - 126 U/L Final  . Total Bilirubin 12/29/2019 0.6  0.3 - 1.2 mg/dL Final  . GFR calc non Af Amer 12/29/2019 >60  >60 mL/min Final  . GFR calc Af Amer 12/29/2019 >60  >60 mL/min Final  . Anion gap 12/29/2019 9  5 - 15 Final   Performed at [facility], [address] [place], [place] [id number]    Assessment:  Bernece Gall Nylund is a 59 y.o. female with polycythemia rubra verainitially diagnosed in 2011. She presented with a black right second toe. Counts were elevated.  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 Epo level was 2.5 (low). LAP score was 77. BCR-ABL was negative. Carbon monoxide level was 0.9%. CLL profile showed a monoclonal B cell population. Abdominal ultrasound on 12/27/2009 revealed splenomegaly(20.73 cm).  Bone marrowon 12/29/2009 revealed a hypercellular marrow with trilineage hematopoiesis.  There was no malignancy or dysplasia. Cytogenetics were normal (46,XX).  She began a phlebotomy program. She undergoes phlebotomy if her hematocrit is >42. Last phlebotomy was on 01/02/2016.  She is on hydroxyurea1000 mg onMonday-Friday, and 500 mgon Saturday and Sunday. She is on a baby aspirin/day.  She has afamily historyof breast  cancer (mother), gastric cancer (grandmother), and skin cancer (sister). She has not had a mammogramsince 06/25/2014. She  had a colonoscopyat age 57.   She has B12 deficiency. B12 was 252 (low) on 06/30/2019, 319 on 07/31/2019 and 248 on 12/29/2019.  She is on oral B12.  Folate was 11.2 on 06/30/2019.  Patient received the Moderna COVID-19 vaccine on 06/13/2019 and 07/11/2019.  Symptomatically, she feels good.  She is taking her B12 intermittently.  Exam is stable.  Plan: 1.   Labs today: CBC with diff, CMP, LDH, B12. 2.Polycythemia rubra vera Hematocrit 39.1.  Hemoglobin 14.0.  MCV 112.7.  Platelets 160,000.  WBC 4200 ([place] 700). Hematocrit goal <= 42. Platelet goal <= 400,000. She has not required a phlebotomy since 12/2015. Continue hydroxyurea 1000 mg on Monday-Friday and 500 mg on Saturday-Sunday.  Continue a baby aspirin a day.  3.   Macrocytosis  Etiology secondary to hydroxyurea.  She has a known B12 deficiency but only takes oral B12 periodically.    Encourage patient to take B12 daily.   If level does not improve at next check, readdress B12 injections.  Folate was 11.2 on 06/30/2019.  TSH was 2.4121 06/30/2019.  Check B12, folate and TSH annually. 4. Hypocalcemia   Calcium was 8.7.  Discuss taking calcium supplementation. 5.   Health maintenance Last mammogram on 06/25/2014.  Recommend follow-up mammogram yearly. 6.   RTC in 3 months for labs (CBC with diff, CMP, B12).   7.   RTC in 6 months for MD assessment and labs (CBC with diff, CMP, LDH).   I discussed the assessment and treatment plan with the patient.  The patient was provided an opportunity to ask questions and all were answered.  The patient agreed with the plan and demonstrated an understanding of the instructions.  The patient was advised to call back if the symptoms worsen or if the condition fails to improve as anticipated.   Lequita Asal, MD, PhD    12/29/2019, 10:36 AM  I, Mirian Mo Tufford, am acting as Education administrator for [employer]. Mike Gip, MD, PhD.  I,  Atiyana Welte C. Mike Gip, MD, have reviewed the above documentation for accuracy and completeness, and I agree with the above.

## 2019-12-28 NOTE — Progress Notes (Signed)
Patient called/screened for oncology follow-up appointment, expresses  complaints of random headaches that causes sharp pain.

## 2019-12-29 ENCOUNTER — Other Ambulatory Visit: Payer: 59

## 2019-12-29 ENCOUNTER — Encounter: Payer: Self-pay | Admitting: Hematology and Oncology

## 2019-12-29 ENCOUNTER — Other Ambulatory Visit: Payer: Self-pay

## 2019-12-29 ENCOUNTER — Inpatient Hospital Stay: Payer: 59 | Attending: Hematology and Oncology

## 2019-12-29 ENCOUNTER — Other Ambulatory Visit: Payer: Self-pay | Admitting: Hematology and Oncology

## 2019-12-29 ENCOUNTER — Ambulatory Visit: Payer: 59 | Admitting: Hematology and Oncology

## 2019-12-29 ENCOUNTER — Inpatient Hospital Stay (HOSPITAL_BASED_OUTPATIENT_CLINIC_OR_DEPARTMENT_OTHER): Payer: 59 | Admitting: Hematology and Oncology

## 2019-12-29 VITALS — BP 125/69 | HR 65 | Temp 95.8°F | Resp 18 | Wt 222.0 lb

## 2019-12-29 DIAGNOSIS — Z87891 Personal history of nicotine dependence: Secondary | ICD-10-CM | POA: Insufficient documentation

## 2019-12-29 DIAGNOSIS — Z79899 Other long term (current) drug therapy: Secondary | ICD-10-CM | POA: Insufficient documentation

## 2019-12-29 DIAGNOSIS — E538 Deficiency of other specified B group vitamins: Secondary | ICD-10-CM | POA: Insufficient documentation

## 2019-12-29 DIAGNOSIS — D7589 Other specified diseases of blood and blood-forming organs: Secondary | ICD-10-CM | POA: Insufficient documentation

## 2019-12-29 DIAGNOSIS — Z7982 Long term (current) use of aspirin: Secondary | ICD-10-CM | POA: Insufficient documentation

## 2019-12-29 DIAGNOSIS — Z0000 Encounter for general adult medical examination without abnormal findings: Secondary | ICD-10-CM | POA: Insufficient documentation

## 2019-12-29 DIAGNOSIS — D45 Polycythemia vera: Secondary | ICD-10-CM

## 2019-12-29 DIAGNOSIS — D751 Secondary polycythemia: Secondary | ICD-10-CM | POA: Insufficient documentation

## 2019-12-29 LAB — CBC WITH DIFFERENTIAL/PLATELET
Abs Immature Granulocytes: 0.02 10*3/uL (ref 0.00–0.07)
Basophils Absolute: 0 10*3/uL (ref 0.0–0.1)
Basophils Relative: 1 %
Eosinophils Absolute: 0 10*3/uL (ref 0.0–0.5)
Eosinophils Relative: 1 %
HCT: 39.1 % (ref 36.0–46.0)
Hemoglobin: 14 g/dL (ref 12.0–15.0)
Immature Granulocytes: 1 %
Lymphocytes Relative: 26 %
Lymphs Abs: 1.1 10*3/uL (ref 0.7–4.0)
MCH: 40.3 pg — ABNORMAL HIGH (ref 26.0–34.0)
MCHC: 35.8 g/dL (ref 30.0–36.0)
MCV: 112.7 fL — ABNORMAL HIGH (ref 80.0–100.0)
Monocytes Absolute: 0.4 10*3/uL (ref 0.1–1.0)
Monocytes Relative: 8 %
Neutro Abs: 2.7 10*3/uL (ref 1.7–7.7)
Neutrophils Relative %: 63 %
Platelets: 160 10*3/uL (ref 150–400)
RBC: 3.47 MIL/uL — ABNORMAL LOW (ref 3.87–5.11)
RDW: 11.6 % (ref 11.5–15.5)
WBC: 4.2 10*3/uL (ref 4.0–10.5)
nRBC: 0 % (ref 0.0–0.2)

## 2019-12-29 LAB — LACTATE DEHYDROGENASE: LDH: 194 U/L — ABNORMAL HIGH (ref 98–192)

## 2019-12-29 LAB — COMPREHENSIVE METABOLIC PANEL
ALT: 20 U/L (ref 0–44)
AST: 18 U/L (ref 15–41)
Albumin: 4.4 g/dL (ref 3.5–5.0)
Alkaline Phosphatase: 65 U/L (ref 38–126)
Anion gap: 9 (ref 5–15)
BUN: 17 mg/dL (ref 6–20)
CO2: 27 mmol/L (ref 22–32)
Calcium: 8.7 mg/dL — ABNORMAL LOW (ref 8.9–10.3)
Chloride: 103 mmol/L (ref 98–111)
Creatinine, Ser: 0.69 mg/dL (ref 0.44–1.00)
GFR calc Af Amer: >60 mL/min (ref >60)
GFR calc non Af Amer: >60 mL/min (ref >60)
Glucose, Bld: 90 mg/dL (ref 70–99)
Potassium: 4.1 mmol/L (ref 3.5–5.1)
Sodium: 139 mmol/L (ref 135–145)
Total Bilirubin: 0.6 mg/dL (ref 0.3–1.2)
Total Protein: 7 g/dL (ref 6.5–8.1)

## 2019-12-29 LAB — VITAMIN B12: Vitamin B-12: 248 pg/mL (ref 180–914)

## 2019-12-29 NOTE — Patient Instructions (Signed)
  Take calcium 600 mg and vitamin D 400 international units by mouth daily.  Take B12 once a day.

## 2019-12-30 ENCOUNTER — Telehealth: Payer: Self-pay

## 2019-12-30 ENCOUNTER — Other Ambulatory Visit: Payer: Self-pay

## 2019-12-30 DIAGNOSIS — E538 Deficiency of other specified B group vitamins: Secondary | ICD-10-CM

## 2019-12-30 NOTE — Telephone Encounter (Signed)
Patient aware to start taking 1016mcg of b12 per dr Mike Gip and to recheck level in one month.

## 2020-01-13 ENCOUNTER — Other Ambulatory Visit: Payer: Self-pay | Admitting: Hematology and Oncology

## 2020-01-13 DIAGNOSIS — D45 Polycythemia vera: Secondary | ICD-10-CM

## 2020-01-27 ENCOUNTER — Inpatient Hospital Stay: Payer: 59 | Attending: Hematology and Oncology

## 2020-01-27 ENCOUNTER — Other Ambulatory Visit: Payer: Self-pay

## 2020-01-27 DIAGNOSIS — E538 Deficiency of other specified B group vitamins: Secondary | ICD-10-CM | POA: Diagnosis not present

## 2020-01-27 LAB — VITAMIN B12: Vitamin B-12: 948 pg/mL — ABNORMAL HIGH (ref 180–914)

## 2020-03-24 ENCOUNTER — Other Ambulatory Visit: Payer: Self-pay

## 2020-03-28 ENCOUNTER — Inpatient Hospital Stay: Payer: 59 | Attending: Hematology and Oncology

## 2020-03-28 ENCOUNTER — Other Ambulatory Visit: Payer: Self-pay

## 2020-03-28 DIAGNOSIS — E538 Deficiency of other specified B group vitamins: Secondary | ICD-10-CM

## 2020-03-28 DIAGNOSIS — D45 Polycythemia vera: Secondary | ICD-10-CM

## 2020-03-28 LAB — COMPREHENSIVE METABOLIC PANEL
ALT: 23 U/L (ref 0–44)
AST: 21 U/L (ref 15–41)
Albumin: 4.4 g/dL (ref 3.5–5.0)
Alkaline Phosphatase: 73 U/L (ref 38–126)
Anion gap: 13 (ref 5–15)
BUN: 14 mg/dL (ref 6–20)
CO2: 27 mmol/L (ref 22–32)
Calcium: 8.5 mg/dL — ABNORMAL LOW (ref 8.9–10.3)
Chloride: 98 mmol/L (ref 98–111)
Creatinine, Ser: 0.73 mg/dL (ref 0.44–1.00)
GFR, Estimated: >60 mL/min (ref >60)
Glucose, Bld: 80 mg/dL (ref 70–99)
Potassium: 4 mmol/L (ref 3.5–5.1)
Sodium: 138 mmol/L (ref 135–145)
Total Bilirubin: 0.8 mg/dL (ref 0.3–1.2)
Total Protein: 7.3 g/dL (ref 6.5–8.1)

## 2020-03-28 LAB — CBC WITH DIFFERENTIAL/PLATELET
Abs Immature Granulocytes: 0.01 10*3/uL (ref 0.00–0.07)
Basophils Absolute: 0 10*3/uL (ref 0.0–0.1)
Basophils Relative: 0 %
Eosinophils Absolute: 0 10*3/uL (ref 0.0–0.5)
Eosinophils Relative: 1 %
HCT: 38.7 % (ref 36.0–46.0)
Hemoglobin: 14 g/dL (ref 12.0–15.0)
Immature Granulocytes: 0 %
Lymphocytes Relative: 26 %
Lymphs Abs: 1 10*3/uL (ref 0.7–4.0)
MCH: 41.4 pg — ABNORMAL HIGH (ref 26.0–34.0)
MCHC: 36.2 g/dL — ABNORMAL HIGH (ref 30.0–36.0)
MCV: 114.5 fL — ABNORMAL HIGH (ref 80.0–100.0)
Monocytes Absolute: 0.3 10*3/uL (ref 0.1–1.0)
Monocytes Relative: 7 %
Neutro Abs: 2.6 10*3/uL (ref 1.7–7.7)
Neutrophils Relative %: 66 %
Platelets: 179 10*3/uL (ref 150–400)
RBC: 3.38 MIL/uL — ABNORMAL LOW (ref 3.87–5.11)
RDW: 12.9 % (ref 11.5–15.5)
WBC: 3.9 10*3/uL — ABNORMAL LOW (ref 4.0–10.5)
nRBC: 0 % (ref 0.0–0.2)

## 2020-03-28 LAB — VITAMIN B12: Vitamin B-12: 971 pg/mL — ABNORMAL HIGH (ref 180–914)

## 2020-03-29 ENCOUNTER — Telehealth: Payer: Self-pay

## 2020-03-29 NOTE — Telephone Encounter (Signed)
-----   Message from Lequita Asal, MD sent at 03/29/2020  4:19 PM EST ----- Regarding: Please call patient  Calcium is low.  Is she taking calcium?  If so, how much?  Typical calcium supplementation is 600 mg po BID.  M  ----- Message ----- From: Buel Ream, [facility] [place] Sent: 03/28/2020   9:25 AM EST To: Lequita Asal, MD

## 2020-03-29 NOTE — Telephone Encounter (Signed)
Patient aware. She has not been taking any but will start calcium 600mg  BID

## 2020-04-04 ENCOUNTER — Other Ambulatory Visit: Payer: Self-pay | Admitting: Internal Medicine

## 2020-04-04 ENCOUNTER — Other Ambulatory Visit: Payer: Self-pay | Admitting: Hematology and Oncology

## 2020-04-04 DIAGNOSIS — Z1231 Encounter for screening mammogram for malignant neoplasm of breast: Secondary | ICD-10-CM

## 2020-04-12 ENCOUNTER — Other Ambulatory Visit: Payer: Self-pay

## 2020-04-12 ENCOUNTER — Ambulatory Visit
Admission: RE | Admit: 2020-04-12 | Discharge: 2020-04-12 | Disposition: A | Discharge status: Home / Self Care | Payer: 59 | Source: Ambulatory Visit | Attending: Hematology and Oncology | Admitting: Hematology and Oncology

## 2020-04-12 DIAGNOSIS — Z1231 Encounter for screening mammogram for malignant neoplasm of breast: Secondary | ICD-10-CM | POA: Insufficient documentation

## 2020-04-13 ENCOUNTER — Other Ambulatory Visit: Payer: Self-pay | Admitting: Hematology and Oncology

## 2020-04-13 DIAGNOSIS — R928 Other abnormal and inconclusive findings on diagnostic imaging of breast: Secondary | ICD-10-CM

## 2020-04-13 DIAGNOSIS — R921 Mammographic calcification found on diagnostic imaging of breast: Secondary | ICD-10-CM

## 2020-05-02 ENCOUNTER — Other Ambulatory Visit: Payer: Self-pay

## 2020-05-02 ENCOUNTER — Ambulatory Visit
Admission: RE | Admit: 2020-05-02 | Discharge: 2020-05-02 | Disposition: A | Discharge status: Home / Self Care | Payer: 59 | Source: Ambulatory Visit | Attending: Hematology and Oncology | Admitting: Hematology and Oncology

## 2020-05-02 DIAGNOSIS — R921 Mammographic calcification found on diagnostic imaging of breast: Secondary | ICD-10-CM | POA: Diagnosis present

## 2020-05-02 DIAGNOSIS — R928 Other abnormal and inconclusive findings on diagnostic imaging of breast: Secondary | ICD-10-CM | POA: Diagnosis not present

## 2020-06-20 ENCOUNTER — Other Ambulatory Visit: Payer: Self-pay | Admitting: Hematology and Oncology

## 2020-06-20 DIAGNOSIS — D45 Polycythemia vera: Secondary | ICD-10-CM

## 2020-06-27 ENCOUNTER — Ambulatory Visit: Payer: 59 | Admitting: Hematology and Oncology

## 2020-06-27 ENCOUNTER — Other Ambulatory Visit: Payer: 59

## 2020-06-29 ENCOUNTER — Other Ambulatory Visit: Payer: Self-pay | Admitting: *Deleted

## 2020-06-29 DIAGNOSIS — E538 Deficiency of other specified B group vitamins: Secondary | ICD-10-CM

## 2020-06-29 NOTE — Progress Notes (Addendum)
[facility]  [address], [address] [place], [place] [address] Phone: [phone]  Fax: [phone]   Clinic Day:  06/30/2020  Referring physician: Kirk Ruths, MD  Chief Complaint: Melissa Patrick is a 60 y.o. 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   HPI: The patient was last seen in the hematology clinic on 12/29/2019. At that time, she felt good.  She was taking B12 intermittently.  Exam was stable. Hematocrit was 39.1, hemoglobin 14.0, platelets 160,000, WBC 4,200. Calcium was 8.7. Vitamin B12 was 248 (low). LDH was 194. She continued hydroxyurea and baby aspirin. She was encouraged to take Vitamin B12 regularly.  Bilateral screening mammogram on 04/12/2020 recommended further evaluation for calcifications in the right breast.  Right diagnostic mammogram on 05/02/2020 revealed probably benign right breast calcifications. Right diagnostic mammogram in 6 months was recommended.  Vitamin B12 was 948 on 01/27/2020.  Labs on 03/28/2020 revealed a hematocrit of 38.7, hemoglobin 14.0, MCV 114.5, platelets 179,000, WBC 3,900 (ANC 2600). Calcium was 8.5 (low) and albumin 4.4. B12 was 971. She was called and instructed to begin calcium 600 mg BID.  During the interim, she has been "pretty good." She has occasional tingling in the sides of her legs. She sees floaters occasionally. The sharp head pains have resolved.  She takes vitamin B12 everyday. She takes hydroxyurea 1000 mg on Monday-Friday and 500 mg on Saturday-Sunday. She takes calcium 600 mg BID with vitamin D.  She has not had a colonoscopy in 7 years.   Past Medical History:  Diagnosis Date  . PV (polycythemia vera) ([place]) 09/08/2014    Past Surgical History:  Procedure Laterality Date  . APPENDECTOMY    . TONSILLECTOMY      Family History  Problem Relation Age of Onset  . Cancer Mother 61       breast  . Arthritis Mother   . Breast cancer Mother    . Cancer Sister        skin  . Stroke Maternal Uncle   . Stroke Maternal Grandmother     Social History:  reports that she quit smoking about 10 years ago. She has never used smokeless tobacco. She reports current alcohol use. She reports that she does not use drugs. 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 She lives in [place]. The patient is alone today.  Allergies:  Allergies  Allergen Reactions  . Dextromoramide     Other reaction(s): Cough  . Penicillin G Hives    Current Medications: Current Outpatient Medications  Medication Sig Dispense Refill  . aspirin 81 MG tablet Take 81 mg by mouth daily.    . Cholecalciferol 50 MCG (2000 UT) TABS Take 2,000 Units by mouth daily.     . hydroxyurea (HYDREA) 500 MG capsule TAKE 2 CAPSULES BY MOUTH MONDAY THROUGH FRIDAY, THEN 1 CAPSULE SATURDAY AND SUNDAY 60 capsule 3   No current facility-administered medications for this visit.    Review of Systems  Constitutional: Positive for weight loss (3 lbs). Negative for chills, diaphoresis, fever and malaise/fatigue.       Doing "pretty good".  HENT: Negative.  Negative for congestion, ear discharge, ear pain, hearing loss, nosebleeds, sinus pain, sore throat and tinnitus.   Eyes: Negative for blurred vision.       Occasional floaters in right eye  Respiratory: Negative.  Negative for cough, hemoptysis, sputum production and shortness of breath.   Cardiovascular: Negative.  Negative for  chest pain, palpitations and leg swelling.  Gastrointestinal: Negative.  Negative for abdominal pain, blood in stool, constipation, diarrhea, heartburn, melena, nausea and vomiting.  Genitourinary: Negative.  Negative for dysuria, frequency, hematuria and urgency.  Musculoskeletal: Negative.  Negative for back pain, joint pain, myalgias and neck pain.  Skin: Negative.  Negative for itching and rash.  Neurological: Positive for sensory change (occasional lateral thigh tingling). Negative for  dizziness, tingling, speech change, focal weakness, weakness and headaches.  Endo/Heme/Allergies: Negative.  Does not bruise/bleed easily.  Psychiatric/Behavioral: Negative.  Negative for depression and memory loss. The patient is not nervous/anxious and does not have insomnia.   All other systems reviewed and are negative.  Performance status (ECOG): 0  Vitals Pulse 70, temperature (!) 97 F (36.1 C), temperature source Tympanic, resp. rate 18, weight 219 lb 11 oz (99.6 kg), SpO2 100 %.   Physical Exam Vitals and nursing note reviewed.  Constitutional:      General: She is not in acute distress.    Appearance: She is well-developed. She is not diaphoretic.  HENT:     Head: Normocephalic and atraumatic.     Comments: Short brown hair.    Mouth/Throat:     Pharynx: No oropharyngeal exudate.  Eyes:     General: No scleral icterus.    Conjunctiva/sclera: Conjunctivae normal.     Pupils: Pupils are equal, round, and reactive to light.     Comments: Glasses. Blue eyes.  Neck:     Vascular: No JVD.  Cardiovascular:     Rate and Rhythm: Normal rate and regular rhythm.     Heart sounds: No murmur heard.   Pulmonary:     Effort: Pulmonary effort is normal. No respiratory distress.     Breath sounds: Normal breath sounds. No wheezing or rales.  Chest:     Chest wall: No tenderness.  Breasts:     Right: No axillary adenopathy or supraclavicular adenopathy.     Left: No axillary adenopathy or supraclavicular adenopathy.    Abdominal:     General: Bowel sounds are normal.     Palpations: Abdomen is soft. There is no hepatomegaly, splenomegaly or mass.     Tenderness: There is no abdominal tenderness. There is no guarding or rebound.  Musculoskeletal:        General: No tenderness. Normal range of motion.     Cervical back: Normal range of motion and neck supple.  Lymphadenopathy:     Head:     Right side of head: No preauricular, posterior auricular or occipital adenopathy.      Left side of head: No preauricular, posterior auricular or occipital adenopathy.     Cervical: No cervical adenopathy.     Upper Body:     Right upper body: No supraclavicular or axillary adenopathy.     Left upper body: No supraclavicular or axillary adenopathy.     Lower Body: No right inguinal adenopathy. No left inguinal adenopathy.  Skin:    General: Skin is warm and dry.  Neurological:     Mental Status: She is alert and oriented to person, place, and time.  Psychiatric:        Behavior: Behavior normal.        Thought Content: Thought content normal.        Judgment: Judgment normal.    Appointment on 06/30/2020  Component Date Value Ref Range Status  . LDH 06/30/2020 185  98 - 192 U/L Final   Performed at [facility], [address], [place], [place] [address]  . Sodium 06/30/2020 141  135 - 145 mmol/L Final  . Potassium 06/30/2020 3.8  3.5 - 5.1 mmol/L Final  . Chloride 06/30/2020 103  98 - 111 mmol/L Final  . CO2 06/30/2020 29  22 - 32 mmol/L Final  . Glucose, Bld 06/30/2020 92  70 - 99 mg/dL Final   Glucose reference range applies only to samples taken after fasting for at least 8 hours.  . BUN 06/30/2020 16  6 - 20 mg/dL Final  . Creatinine, Ser 06/30/2020 0.75  0.44 - 1.00 mg/dL Final  . Calcium 06/30/2020 9.3  8.9 - 10.3 mg/dL Final  . Total Protein 06/30/2020 7.3  6.5 - 8.1 g/dL Final  . Albumin 06/30/2020 4.5  3.5 - 5.0 g/dL Final  . AST 06/30/2020 19  15 - 41 U/L Final  . ALT 06/30/2020 19  0 - 44 U/L Final  . Alkaline Phosphatase 06/30/2020 62  38 - 126 U/L Final  . Total Bilirubin 06/30/2020 0.6  0.3 - 1.2 mg/dL Final  . GFR, Estimated 06/30/2020 >60  >60 mL/min Final   Comment: (NOTE) Calculated using the CKD-EPI Creatinine Equation (2021)   . Anion gap 06/30/2020 9  5 - 15 Final   Performed at [facility], [address], [place], [place] [address]  . WBC 06/30/2020 4.3  4.0 - 10.5 K/uL Final  . RBC 06/30/2020 3.15* 3.87 -  5.11 MIL/uL Final  . Hemoglobin 06/30/2020 13.5  12.0 - 15.0 g/dL Final  . HCT 06/30/2020 37.5  36.0 - 46.0 % Final  . MCV 06/30/2020 119.0* 80.0 - 100.0 fL Final  . MCH 06/30/2020 42.9* 26.0 - 34.0 pg Final  . MCHC 06/30/2020 36.0  30.0 - 36.0 g/dL Final  . RDW 06/30/2020 11.9  11.5 - 15.5 % Final  . Platelets 06/30/2020 185  150 - 400 K/uL Final  . nRBC 06/30/2020 0.0  0.0 - 0.2 % Final  . Neutrophils Relative % 06/30/2020 71  % Final  . Neutro Abs 06/30/2020 3.0  1.7 - 7.7 K/uL Final  . Lymphocytes Relative 06/30/2020 23  % Final  . Lymphs Abs 06/30/2020 1.0  0.7 - 4.0 K/uL Final  . Monocytes Relative 06/30/2020 5  % Final  . Monocytes Absolute 06/30/2020 0.2  0.1 - 1.0 K/uL Final  . Eosinophils Relative 06/30/2020 1  % Final  . Eosinophils Absolute 06/30/2020 0.0  0.0 - 0.5 K/uL Final  . Basophils Relative 06/30/2020 0  % Final  . Basophils Absolute 06/30/2020 0.0  0.0 - 0.1 K/uL Final  . Immature Granulocytes 06/30/2020 0  % Final  . Abs Immature Granulocytes 06/30/2020 0.01  0.00 - 0.07 K/uL Final   Performed at [facility], [address], [place],  [address]    Assessment:  Saul Dorsi Mcvicker is a 60 y.o. female with polycythemia rubra verainitially diagnosed in 2011. She presented with a black right second toe. Counts were elevated.  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 Epo level was 2.5 (low). LAP score was 77. BCR-ABL was negative. Carbon monoxide level was 0.9%. CLL profile showed a monoclonal B cell population. Abdominal ultrasound on 12/27/2009 revealed splenomegaly(20.73 cm).  Bone marrowon 12/29/2009 revealed a hypercellular marrow with trilineage hematopoiesis.  There was no malignancy or dysplasia. Cytogenetics were normal (46,XX).  She began a phlebotomy program. She undergoes phlebotomy if her hematocrit is >42. Last phlebotomy was on 01/02/2016.  She is on hydroxyurea1000 mg  onMonday-Friday, and 500 mgon  Saturday and Sunday. She is on a baby aspirin/day.  She has afamily historyof breast cancer (mother), gastric cancer (grandmother), and skin cancer (sister). She has not had a mammogramsince 06/25/2014. She had a colonoscopyat age 4.   She has B12 deficiency. B12 was 252 (low) on 06/30/2019, 319 on 07/31/2019 248 on 12/29/2019, 948 on 01/27/2020, and 971 on 03/28/2020.  She is on oral B12.  Folate was 11.2 on 06/30/2019.  Patient received the Moderna COVID-19 vaccine on 06/13/2019 and 07/11/2019.  Symptomatically, she feels "pretty good." She has occasional tingling in the sides of her legs. The sharp head pains have resolved. Exam is stable.  Plan: 1.   Labs today: CBC with diff, CMP, LDH, folate. 2.Polycythemia rubra vera Hematocrit 37.5.  Hemoglobin 13.5.  MCV 119.0.  Platelets 185,000.  WBC 4300 ([place] 3000). Hematocrit goal <= 42. Platelet goal <= 400,000. She has not required a phlebotomy since 12/2015. Continue hydroxyurea 1000 mg on Monday-Friday and 500 mg on Saturday-Sunday.  Continue a baby aspirin once a day.  3.   Macrocytosis  Etiology secondary to hydroxyurea.  She has a known B12 deficiency and is now taking B12 daily.    B12 was 971 on 03/28/2020.  Folate is 14.0 today.  TSH was 2.412 on 06/30/2019.  Check B12, folate and TSH annually. 4. Hypocalcemia, resolved   Calcium was 9.3.  She is on calcium 600 mg p.o. twice daily with vitamin D. 5.   Abnormal mammogram Bilateral screening mammogram on 04/12/2020 revealed calcifications in the right breast.    Right diagnostic mammogram on 05/02/2020 revealed probably benign right breast calcifications.    Right diagnostic mammogram in 6 months was recommended. 6.   Health maintenance  Encourage colonoscopy. 7.   RTC in 3 months for labs (CBC with diff, CMP).   8.   RTC in 6 months for MD assessment and labs (CBC with diff, CMP, LDH).   I discussed  the assessment and treatment plan with the patient.  The patient was provided an opportunity to ask questions and all were answered.  The patient agreed with the plan and demonstrated an understanding of the instructions.  The patient was advised to call back if the symptoms worsen or if the condition fails to improve as anticipated.   Lequita Asal, MD, PhD    06/30/2020, 10:40 AM  I, Mirian Mo Tufford, am acting as Education administrator for [employer]. Mike Gip, MD, PhD.  I, Raydell Maners C. Mike Gip, MD, have reviewed the above documentation for accuracy and completeness, and I agree with the above.

## 2020-06-30 ENCOUNTER — Encounter: Payer: Self-pay | Admitting: Hematology and Oncology

## 2020-06-30 ENCOUNTER — Inpatient Hospital Stay (HOSPITAL_BASED_OUTPATIENT_CLINIC_OR_DEPARTMENT_OTHER): Payer: BC Managed Care – PPO | Admitting: Hematology and Oncology

## 2020-06-30 ENCOUNTER — Other Ambulatory Visit: Payer: Self-pay

## 2020-06-30 ENCOUNTER — Other Ambulatory Visit: Payer: Self-pay | Admitting: Hematology and Oncology

## 2020-06-30 ENCOUNTER — Inpatient Hospital Stay: Payer: BC Managed Care – PPO | Attending: Hematology and Oncology

## 2020-06-30 VITALS — HR 70 | Temp 97.0°F | Resp 18 | Wt 219.7 lb

## 2020-06-30 DIAGNOSIS — E538 Deficiency of other specified B group vitamins: Secondary | ICD-10-CM | POA: Diagnosis not present

## 2020-06-30 DIAGNOSIS — D45 Polycythemia vera: Secondary | ICD-10-CM | POA: Diagnosis not present

## 2020-06-30 DIAGNOSIS — D7589 Other specified diseases of blood and blood-forming organs: Secondary | ICD-10-CM | POA: Insufficient documentation

## 2020-06-30 DIAGNOSIS — Z87891 Personal history of nicotine dependence: Secondary | ICD-10-CM | POA: Diagnosis not present

## 2020-06-30 DIAGNOSIS — D751 Secondary polycythemia: Secondary | ICD-10-CM | POA: Diagnosis not present

## 2020-06-30 DIAGNOSIS — R928 Other abnormal and inconclusive findings on diagnostic imaging of breast: Secondary | ICD-10-CM | POA: Insufficient documentation

## 2020-06-30 LAB — LACTATE DEHYDROGENASE: LDH: 185 U/L (ref 98–192)

## 2020-06-30 LAB — COMPREHENSIVE METABOLIC PANEL
ALT: 19 U/L (ref 0–44)
AST: 19 U/L (ref 15–41)
Albumin: 4.5 g/dL (ref 3.5–5.0)
Alkaline Phosphatase: 62 U/L (ref 38–126)
Anion gap: 9 (ref 5–15)
BUN: 16 mg/dL (ref 6–20)
CO2: 29 mmol/L (ref 22–32)
Calcium: 9.3 mg/dL (ref 8.9–10.3)
Chloride: 103 mmol/L (ref 98–111)
Creatinine, Ser: 0.75 mg/dL (ref 0.44–1.00)
GFR, Estimated: >60 mL/min (ref >60)
Glucose, Bld: 92 mg/dL (ref 70–99)
Potassium: 3.8 mmol/L (ref 3.5–5.1)
Sodium: 141 mmol/L (ref 135–145)
Total Bilirubin: 0.6 mg/dL (ref 0.3–1.2)
Total Protein: 7.3 g/dL (ref 6.5–8.1)

## 2020-06-30 LAB — CBC WITH DIFFERENTIAL/PLATELET
Abs Immature Granulocytes: 0.01 10*3/uL (ref 0.00–0.07)
Basophils Absolute: 0 10*3/uL (ref 0.0–0.1)
Basophils Relative: 0 %
Eosinophils Absolute: 0 10*3/uL (ref 0.0–0.5)
Eosinophils Relative: 1 %
HCT: 37.5 % (ref 36.0–46.0)
Hemoglobin: 13.5 g/dL (ref 12.0–15.0)
Immature Granulocytes: 0 %
Lymphocytes Relative: 23 %
Lymphs Abs: 1 10*3/uL (ref 0.7–4.0)
MCH: 42.9 pg — ABNORMAL HIGH (ref 26.0–34.0)
MCHC: 36 g/dL (ref 30.0–36.0)
MCV: 119 fL — ABNORMAL HIGH (ref 80.0–100.0)
Monocytes Absolute: 0.2 10*3/uL (ref 0.1–1.0)
Monocytes Relative: 5 %
Neutro Abs: 3 10*3/uL (ref 1.7–7.7)
Neutrophils Relative %: 71 %
Platelets: 185 10*3/uL (ref 150–400)
RBC: 3.15 MIL/uL — ABNORMAL LOW (ref 3.87–5.11)
RDW: 11.9 % (ref 11.5–15.5)
WBC: 4.3 10*3/uL (ref 4.0–10.5)
nRBC: 0 % (ref 0.0–0.2)

## 2020-06-30 LAB — FOLATE: Folate: 14 ng/mL (ref >5.9)

## 2020-06-30 NOTE — Progress Notes (Signed)
Pt in for follow up, denies any difficulties or concerns.  

## 2020-07-24 DIAGNOSIS — R928 Other abnormal and inconclusive findings on diagnostic imaging of breast: Secondary | ICD-10-CM | POA: Insufficient documentation

## 2020-09-27 ENCOUNTER — Other Ambulatory Visit: Payer: BC Managed Care – PPO

## 2020-09-29 ENCOUNTER — Other Ambulatory Visit: Payer: Self-pay

## 2020-09-29 DIAGNOSIS — D45 Polycythemia vera: Secondary | ICD-10-CM

## 2020-09-30 ENCOUNTER — Other Ambulatory Visit: Payer: Self-pay

## 2020-09-30 ENCOUNTER — Inpatient Hospital Stay: Payer: BC Managed Care – PPO | Attending: Internal Medicine

## 2020-09-30 DIAGNOSIS — Z7982 Long term (current) use of aspirin: Secondary | ICD-10-CM | POA: Diagnosis not present

## 2020-09-30 DIAGNOSIS — D45 Polycythemia vera: Secondary | ICD-10-CM | POA: Diagnosis not present

## 2020-09-30 DIAGNOSIS — Z79899 Other long term (current) drug therapy: Secondary | ICD-10-CM | POA: Insufficient documentation

## 2020-09-30 LAB — COMPREHENSIVE METABOLIC PANEL
ALT: 24 U/L (ref 0–44)
AST: 22 U/L (ref 15–41)
Albumin: 4.5 g/dL (ref 3.5–5.0)
Alkaline Phosphatase: 76 U/L (ref 38–126)
Anion gap: 7 (ref 5–15)
BUN: 11 mg/dL (ref 6–20)
CO2: 30 mmol/L (ref 22–32)
Calcium: 9.8 mg/dL (ref 8.9–10.3)
Chloride: 102 mmol/L (ref 98–111)
Creatinine, Ser: 0.75 mg/dL (ref 0.44–1.00)
GFR, Estimated: >60 mL/min (ref >60)
Glucose, Bld: 86 mg/dL (ref 70–99)
Potassium: 4.2 mmol/L (ref 3.5–5.1)
Sodium: 139 mmol/L (ref 135–145)
Total Bilirubin: 0.6 mg/dL (ref 0.3–1.2)
Total Protein: 7.5 g/dL (ref 6.5–8.1)

## 2020-09-30 LAB — CBC WITH DIFFERENTIAL/PLATELET
Abs Immature Granulocytes: 0.01 10*3/uL (ref 0.00–0.07)
Basophils Absolute: 0 10*3/uL (ref 0.0–0.1)
Basophils Relative: 0 %
Eosinophils Absolute: 0 10*3/uL (ref 0.0–0.5)
Eosinophils Relative: 1 %
HCT: 38.9 % (ref 36.0–46.0)
Hemoglobin: 14.3 g/dL (ref 12.0–15.0)
Immature Granulocytes: 0 %
Lymphocytes Relative: 28 %
Lymphs Abs: 1.2 10*3/uL (ref 0.7–4.0)
MCH: 42.2 pg — ABNORMAL HIGH (ref 26.0–34.0)
MCHC: 36.8 g/dL — ABNORMAL HIGH (ref 30.0–36.0)
MCV: 114.7 fL — ABNORMAL HIGH (ref 80.0–100.0)
Monocytes Absolute: 0.3 10*3/uL (ref 0.1–1.0)
Monocytes Relative: 8 %
Neutro Abs: 2.7 10*3/uL (ref 1.7–7.7)
Neutrophils Relative %: 63 %
Platelets: 173 10*3/uL (ref 150–400)
RBC: 3.39 MIL/uL — ABNORMAL LOW (ref 3.87–5.11)
RDW: 11.7 % (ref 11.5–15.5)
WBC: 4.3 10*3/uL (ref 4.0–10.5)
nRBC: 0 % (ref 0.0–0.2)

## 2020-11-24 ENCOUNTER — Other Ambulatory Visit: Payer: Self-pay | Admitting: *Deleted

## 2020-11-24 DIAGNOSIS — D45 Polycythemia vera: Secondary | ICD-10-CM

## 2020-11-24 MED ORDER — HYDROXYUREA 500 MG PO CAPS: ORAL_CAPSULE | ORAL | 3 refills | Status: DC

## 2020-11-24 NOTE — Telephone Encounter (Signed)
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 Scheduled to see Dr. Jacinto Reap on 12/27/20. Dr. Jacinto Reap - please advise on Refill.

## 2020-11-25 ENCOUNTER — Other Ambulatory Visit: Payer: Self-pay | Admitting: *Deleted

## 2020-11-25 DIAGNOSIS — D45 Polycythemia vera: Secondary | ICD-10-CM

## 2020-11-25 MED ORDER — HYDROXYUREA 500 MG PO CAPS: ORAL_CAPSULE | ORAL | 3 refills | Status: DC

## 2020-12-20 ENCOUNTER — Encounter: Payer: Self-pay | Admitting: Internal Medicine

## 2020-12-27 ENCOUNTER — Inpatient Hospital Stay: Payer: BC Managed Care – PPO | Attending: Hematology and Oncology | Admitting: Internal Medicine

## 2020-12-27 ENCOUNTER — Other Ambulatory Visit: Payer: Self-pay

## 2020-12-27 ENCOUNTER — Encounter: Payer: Self-pay | Admitting: Internal Medicine

## 2020-12-27 ENCOUNTER — Inpatient Hospital Stay: Payer: BC Managed Care – PPO

## 2020-12-27 VITALS — BP 116/63 | HR 70 | Temp 97.3°F | Resp 18 | Wt 217.5 lb

## 2020-12-27 DIAGNOSIS — R928 Other abnormal and inconclusive findings on diagnostic imaging of breast: Secondary | ICD-10-CM | POA: Insufficient documentation

## 2020-12-27 DIAGNOSIS — D45 Polycythemia vera: Secondary | ICD-10-CM

## 2020-12-27 DIAGNOSIS — Z79899 Other long term (current) drug therapy: Secondary | ICD-10-CM | POA: Diagnosis not present

## 2020-12-27 LAB — CBC WITH DIFFERENTIAL/PLATELET
Abs Immature Granulocytes: 0.01 10*3/uL (ref 0.00–0.07)
Basophils Absolute: 0 10*3/uL (ref 0.0–0.1)
Basophils Relative: 0 %
Eosinophils Absolute: 0 10*3/uL (ref 0.0–0.5)
Eosinophils Relative: 1 %
HCT: 35 % — ABNORMAL LOW (ref 36.0–46.0)
Hemoglobin: 12.7 g/dL (ref 12.0–15.0)
Immature Granulocytes: 0 %
Lymphocytes Relative: 24 %
Lymphs Abs: 0.8 10*3/uL (ref 0.7–4.0)
MCH: 40.4 pg — ABNORMAL HIGH (ref 26.0–34.0)
MCHC: 36.3 g/dL — ABNORMAL HIGH (ref 30.0–36.0)
MCV: 111.5 fL — ABNORMAL HIGH (ref 80.0–100.0)
Monocytes Absolute: 0.3 10*3/uL (ref 0.1–1.0)
Monocytes Relative: 7 %
Neutro Abs: 2.4 10*3/uL (ref 1.7–7.7)
Neutrophils Relative %: 68 %
Platelets: 168 10*3/uL (ref 150–400)
RBC: 3.14 MIL/uL — ABNORMAL LOW (ref 3.87–5.11)
RDW: 12.4 % (ref 11.5–15.5)
WBC: 3.6 10*3/uL — ABNORMAL LOW (ref 4.0–10.5)
nRBC: 0 % (ref 0.0–0.2)

## 2020-12-27 LAB — COMPREHENSIVE METABOLIC PANEL
ALT: 23 U/L (ref 0–44)
AST: 20 U/L (ref 15–41)
Albumin: 4.1 g/dL (ref 3.5–5.0)
Alkaline Phosphatase: 70 U/L (ref 38–126)
Anion gap: 4 — ABNORMAL LOW (ref 5–15)
BUN: 19 mg/dL (ref 6–20)
CO2: 29 mmol/L (ref 22–32)
Calcium: 9 mg/dL (ref 8.9–10.3)
Chloride: 103 mmol/L (ref 98–111)
Creatinine, Ser: 0.79 mg/dL (ref 0.44–1.00)
GFR, Estimated: >60 mL/min (ref >60)
Glucose, Bld: 107 mg/dL — ABNORMAL HIGH (ref 70–99)
Potassium: 4.1 mmol/L (ref 3.5–5.1)
Sodium: 136 mmol/L (ref 135–145)
Total Bilirubin: 0.6 mg/dL (ref 0.3–1.2)
Total Protein: 6.5 g/dL (ref 6.5–8.1)

## 2020-12-27 LAB — LACTATE DEHYDROGENASE: LDH: 169 U/L (ref 98–192)

## 2020-12-27 NOTE — Assessment & Plan Note (Addendum)
#   Polycythemia vera; JAK-2 positive Today hematocrit is 43. Patient is on Hydrea [2 every days; 1 on sat/sun] and aspirin. STABLE.  No concerns for transformation.  # Tolerating Hydrea well; continue Hydrea at current dose.  # Breast Calcification: on Diagnostic in Jan 2022- will order Mammo- Diagnostic right/6 months follow-up.  # DISPOSITION: # Right  Breast mammo-Diagnostic/calcifications follow up # follow up in 6 months- mD; labs- cbc/cmp;LDH-dr.B

## 2020-12-27 NOTE — Progress Notes (Signed)
[place] OFFICE PROGRESS NOTE  Patient Care Team: Kirk Ruths, MD as PCP - General (Internal Medicine)  Cancer Staging No matching staging information was found for the patient.   Oncology History Overview Note  Diagnosis: Low- risk [id number]- positive Polycythemia Vera     HPI: Initially presented with elevated hemoglobin and white blood cell count in 2011 [rght foot second toe- black"- Dr.[facility]; Dr.Pandit].  a. 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 b. 12/23/09: EPO level 2.5 extremely low, Lap score 77, Bcr-Abl -Carbon monoxide level 0.9 %, CLL profile: monoclonal b cell population, c. 12/27/09 : abd U/S: splenomegaly 20 cm. d. 12/29/09: bone marrow biopsy and aspiration: hypercellular marrow with trilineage hematopoiesis.  No malignancy or dysplasia identified in marrow specimen.  28 XY. Patient was started on phlebotomies, but hydroxyurea had to be initiated because of difficulties performing phlebotomies secondary to poor venous access. -----------------------------------    DIAGNOSIS: POLYCYTHEMIA VERA- JAK-2 +  GOALS: control  CURRENT/MOST RECENT THERAPY: Hydrea    PV (polycythemia vera) ([facility])     INTERVAL HISTORY:  Melissa Patrick 60 y.o.  female pleasant patient above history of Polycythemia vera currently on aspirin and Hydrea-is here for follow-up.  Patient denies any blood clots.  Denies any headaches.  Denies any nausea vomiting.  No weight loss.  No diarrhea.  Review of Systems  Constitutional:  Negative for chills, diaphoresis, fever, malaise/fatigue and weight loss.  HENT:  Negative for nosebleeds and sore throat.   Eyes:  Negative for double vision.  Respiratory:  Negative for cough, hemoptysis, sputum production, shortness of breath and wheezing.   Cardiovascular:  Negative for chest pain, palpitations, orthopnea and leg swelling.  Gastrointestinal:  Negative for abdominal pain, blood in stool,  constipation, diarrhea, heartburn, melena, nausea and vomiting.  Genitourinary:  Negative for dysuria, frequency and urgency.  Musculoskeletal:  Negative for back pain and joint pain.  Skin: Negative.  Negative for itching and rash.  Neurological:  Negative for dizziness, tingling, focal weakness, weakness and headaches.  Endo/Heme/Allergies:  Does not bruise/bleed easily.  Psychiatric/Behavioral:  Negative for depression. The patient is not nervous/anxious and does not have insomnia.     PAST MEDICAL HISTORY :  Past Medical History:  Diagnosis Date  . PV (polycythemia vera) ([place]) 09/08/2014    PAST SURGICAL HISTORY :   Past Surgical History:  Procedure Laterality Date  . APPENDECTOMY    . TONSILLECTOMY      FAMILY HISTORY :   Family History  Problem Relation Age of Onset  . Cancer Mother 26       breast  . Arthritis Mother   . Breast cancer Mother   . Cancer Sister        skin  . Stroke Maternal Uncle   . Stroke Maternal Grandmother     SOCIAL HISTORY:   Social History   Tobacco Use  . Smoking status: Former    Types: Cigarettes    Quit date: 02/23/2010    Years since quitting: 10.8  . Smokeless tobacco: Never  Substance Use Topics  . Alcohol use: Yes    Alcohol/week: 0.0 standard drinks    Comment: occasional about once a year at most  . Drug use: No    ALLERGIES:  is allergic to dextromoramide and penicillin g.  MEDICATIONS:  Current Outpatient Medications  Medication Sig Dispense Refill  . aspirin 81 MG tablet Take 81 mg by mouth daily.    . Cholecalciferol 50 MCG (  2000 UT) TABS Take 2,000 Units by mouth daily.     . hydroxyurea (HYDREA) 500 MG capsule May take with food to minimize GI side effects. 60 capsule 3   No current facility-administered medications for this visit.    PHYSICAL EXAMINATION: ECOG PERFORMANCE STATUS: 0 - Asymptomatic  BP 116/63   Pulse 70   Temp (!) 97.3 F (36.3 C)   Resp 18   Wt 217 lb 7.7 oz (98.6 kg)   SpO2 100%    BMI 35.10 kg/m   Filed Weights   12/27/20 0950  Weight: 217 lb 7.7 oz (98.6 kg)     Physical Exam HENT:     Head: Normocephalic and atraumatic.     Mouth/Throat:     Pharynx: No oropharyngeal exudate.  Eyes:     Pupils: Pupils are equal, round, and reactive to light.  Cardiovascular:     Rate and Rhythm: Normal rate and regular rhythm.  Pulmonary:     Effort: No respiratory distress.     Breath sounds: No wheezing.  Abdominal:     General: Bowel sounds are normal. There is no distension.     Palpations: Abdomen is soft. There is no mass.     Tenderness: There is no abdominal tenderness. There is no guarding or rebound.  Musculoskeletal:        General: No tenderness. Normal range of motion.     Cervical back: Normal range of motion and neck supple.  Skin:    General: Skin is warm.  Neurological:     Mental Status: She is alert and oriented to person, place, and time.  Psychiatric:        Mood and Affect: Affect normal.     LABORATORY DATA:  I have reviewed the data as listed    Component Value Date/Time   NA 136 12/27/2020 0943   NA 140 10/08/2013 1021   K 4.1 12/27/2020 0943   K 4.3 10/08/2013 1021   CL 103 12/27/2020 0943   CL 103 10/08/2013 1021   CO2 29 12/27/2020 0943   CO2 31 10/08/2013 1021   GLUCOSE 107 (H) 12/27/2020 0943   GLUCOSE 98 10/08/2013 1021   BUN 19 12/27/2020 0943   BUN 11 10/08/2013 1021   CREATININE 0.79 12/27/2020 0943   CREATININE 0.89 03/25/2014 0938   CALCIUM 9.0 12/27/2020 0943   CALCIUM 8.4 (L) 10/08/2013 1021   PROT 6.5 12/27/2020 0943   PROT 6.7 03/25/2014 0938   ALBUMIN 4.1 12/27/2020 0943   ALBUMIN 3.7 03/25/2014 0938   AST 20 12/27/2020 0943   AST 22 03/25/2014 0938   ALT 23 12/27/2020 0943   ALT 35 03/25/2014 0938   ALKPHOS 70 12/27/2020 0943   ALKPHOS 84 03/25/2014 0938   BILITOT 0.6 12/27/2020 0943   BILITOT 0.6 03/25/2014 0938   GFRNONAA >60 12/27/2020 0943   GFRNONAA >60 03/25/2014 0938   GFRNONAA >60  12/31/2013 0937   GFRAA >60 12/29/2019 0926   GFRAA >60 03/25/2014 0938   GFRAA >60 12/31/2013 0937    No results found for: SPEP, UPEP  Lab Results  Component Value Date   WBC 3.6 (L) 12/27/2020   NEUTROABS 2.4 12/27/2020   HGB 12.7 12/27/2020   HCT 35.0 (L) 12/27/2020   MCV 111.5 (H) 12/27/2020   PLT 168 12/27/2020      Chemistry      Component Value Date/Time   NA 136 12/27/2020 0943   NA 140 10/08/2013 1021   K 4.1 12/27/2020  0943   K 4.3 10/08/2013 1021   CL 103 12/27/2020 0943   CL 103 10/08/2013 1021   CO2 29 12/27/2020 0943   CO2 31 10/08/2013 1021   BUN 19 12/27/2020 0943   BUN 11 10/08/2013 1021   CREATININE 0.79 12/27/2020 0943   CREATININE 0.89 03/25/2014 0938      Component Value Date/Time   CALCIUM 9.0 12/27/2020 0943   CALCIUM 8.4 (L) 10/08/2013 1021   ALKPHOS 70 12/27/2020 0943   ALKPHOS 84 03/25/2014 0938   AST 20 12/27/2020 0943   AST 22 03/25/2014 0938   ALT 23 12/27/2020 0943   ALT 35 03/25/2014 0938   BILITOT 0.6 12/27/2020 0943   BILITOT 0.6 03/25/2014 0938       RADIOGRAPHIC STUDIES: I have personally reviewed the radiological images as listed and agreed with the findings in the report. No results found.   ASSESSMENT & PLAN:  PV (polycythemia vera) ([place]) # Polycythemia vera; JAK-2 positive Today hematocrit is 43. Patient is on Hydrea [2 every days; 1 on sat/sun] and aspirin. STABLE.  No concerns for transformation.  # Tolerating Hydrea well; continue Hydrea at current dose.  # Breast Calcification: on Diagnostic in Jan 2022- will order Mammo- Diagnostic right/6 months follow-up.  # DISPOSITION: # Right  Breast mammo-Diagnostic/calcifications follow up # follow up in 6 months- mD; labs- cbc/cmp;LDH-dr.B   Orders Placed This Encounter  Procedures  . MM DIAG BREAST TOMO UNI RIGHT    Standing Status:   Future    Standing Expiration Date:   12/27/2021    Order Specific Question:   Reason for Exam (SYMPTOM  OR DIAGNOSIS REQUIRED)     Answer:   Right Breast mammo-Diagnostic/calcifications follow up    Order Specific Question:   Is the patient pregnant?    Answer:   No    Order Specific Question:   Preferred imaging location?    Answer:   [facility]  . US Breast Limited Uni Right Inc [place]    Standing Status:   Future    Standing Expiration Date:   06/26/2021    Order Specific Question:   Reason for Exam (SYMPTOM  OR DIAGNOSIS REQUIRED)    Answer:   Right Breast mammo-Diagnostic/calcifications follow up    Order Specific Question:   Preferred imaging location?    Answer:   [facility]  . CBC with Differential    Standing Status:   Future    Standing Expiration Date:   12/27/2021  . Comprehensive metabolic panel    Standing Status:   Future    Standing Expiration Date:   12/27/2021  . Lactate dehydrogenase    Standing Status:   Future    Standing Expiration Date:   12/27/2021   All questions were answered. The patient knows to call the clinic with any problems, questions or concerns.      Cammie Sickle, MD 12/27/2020 12:46 PM

## 2021-01-04 ENCOUNTER — Other Ambulatory Visit: Payer: Self-pay

## 2021-01-04 ENCOUNTER — Ambulatory Visit
Admission: RE | Admit: 2021-01-04 | Discharge: 2021-01-04 | Disposition: A | Discharge status: Home / Self Care | Payer: BC Managed Care – PPO | Source: Ambulatory Visit | Attending: Internal Medicine | Admitting: Internal Medicine

## 2021-01-04 ENCOUNTER — Other Ambulatory Visit: Payer: Self-pay | Admitting: Internal Medicine

## 2021-01-04 DIAGNOSIS — R928 Other abnormal and inconclusive findings on diagnostic imaging of breast: Secondary | ICD-10-CM

## 2021-01-04 DIAGNOSIS — R921 Mammographic calcification found on diagnostic imaging of breast: Secondary | ICD-10-CM

## 2021-01-07 ENCOUNTER — Encounter: Payer: Self-pay | Admitting: Internal Medicine

## 2021-01-07 NOTE — Progress Notes (Signed)
Call the patient regarding the results of the mammogram. I'll wait biopsy as planned.

## 2021-01-11 ENCOUNTER — Other Ambulatory Visit: Payer: Self-pay

## 2021-01-11 ENCOUNTER — Ambulatory Visit
Admission: RE | Admit: 2021-01-11 | Discharge: 2021-01-11 | Disposition: A | Discharge status: Home / Self Care | Payer: BC Managed Care – PPO | Source: Ambulatory Visit | Attending: Internal Medicine | Admitting: Internal Medicine

## 2021-01-11 DIAGNOSIS — R928 Other abnormal and inconclusive findings on diagnostic imaging of breast: Secondary | ICD-10-CM

## 2021-01-11 DIAGNOSIS — R921 Mammographic calcification found on diagnostic imaging of breast: Secondary | ICD-10-CM | POA: Diagnosis present

## 2021-01-11 HISTORY — PX: BREAST BIOPSY: SHX20

## 2021-01-12 LAB — SURGICAL PATHOLOGY

## 2021-06-05 ENCOUNTER — Other Ambulatory Visit: Payer: Self-pay | Admitting: *Deleted

## 2021-06-05 DIAGNOSIS — D45 Polycythemia vera: Secondary | ICD-10-CM

## 2021-06-05 MED ORDER — HYDROXYUREA 500 MG PO CAPS: ORAL_CAPSULE | ORAL | 3 refills | Status: DC

## 2021-06-05 NOTE — Telephone Encounter (Signed)
CBC with Differential/Platelet Order: [id number] Status: Final result    Visible to patient: Yes (seen)    Next appt: 06/26/2021 at 09:45 AM in Oncology ([facility])    Dx: PV (polycythemia vera) (HCC)    0 Result Notes           Component Ref Range & Units 5 mo ago (12/27/20) 8 mo ago (09/30/20) 11 mo ago (06/30/20) 1 yr ago (03/28/20) 1 yr ago (12/29/19) 1 yr ago (09/29/19) 1 yr ago (06/30/19)  WBC 4.0 - 10.5 K/uL 3.6 Low   4.3  4.3  3.9 Low   4.2  4.4  3.5 Low    RBC 3.87 - 5.11 MIL/uL 3.14 Low   3.39 Low   3.15 Low   3.38 Low   3.47 Low   3.33 Low   3.33 Low    Hemoglobin 12.0 - 15.0 g/dL 12.7  14.3  13.5  14.0  14.0  14.0  14.1   HCT 36.0 - 46.0 % 35.0 Low   38.9  37.5  38.7  39.1  38.8  39.1   MCV 80.0 - 100.0 fL 111.5 High   114.7 High   119.0 High   114.5 High   112.7 High   116.5 High   117.4 High    MCH 26.0 - 34.0 pg 40.4 High   42.2 High   42.9 High   41.4 High   40.3 High   42.0 High   42.3 High    MCHC 30.0 - 36.0 g/dL 36.3 High   36.8 High   36.0  36.2 High   35.8  36.1 High   36.1 High    RDW 11.5 - 15.5 % 12.4  11.7  11.9  12.9  11.6  11.7  12.1   Platelets 150 - 400 K/uL 168  173  185  179  160  187  136 Low    nRBC 0.0 - 0.2 % 0.0  0.0  0.0  0.0  0.0  0.0  0.0   Neutrophils Relative % % 68  63  71  66  63  65  61   Neutro Abs 1.7 - 7.7 K/uL 2.4  2.7  3.0  2.6  2.7  2.9  2.1   Lymphocytes Relative % 24  28  23  26  26  27  29    Lymphs Abs 0.7 - 4.0 K/uL 0.8  1.2  1.0  1.0  1.1  1.2  1.0   Monocytes Relative % 7  8  5  7  8  6  9    Monocytes Absolute 0.1 - 1.0 K/uL 0.3  0.3  0.2  0.3  0.4  0.3  0.3   Eosinophils Relative % 1  1  1  1  1  1  1    Eosinophils Absolute 0.0 - 0.5 K/uL 0.0  0.0  0.0  0.0  0.0  0.0  0.0   Basophils Relative % 0  0  0  0  1  1  0   Basophils Absolute 0.0 - 0.1 K/uL 0.0  0.0  0.0  0.0  0.0  0.0  0.0   Immature Granulocytes % 0  0  0  0  1  0  0   Abs Immature Granulocytes 0.00 - 0.07 K/uL 0.01  0.01 CM  0.01 CM  0.01 CM  0.02 CM  0.01 CM  0.01  CM   Comment: Performed at [facility],  [address] [place], [place] [address]  Resulting Agency  [place] [facility] [place] [facility] [place] [facility] [place] [facility] [place] [facility] [place] [facility] [place] [facility]         Specimen Collected: 12/27/20 09:43 Last Resulted: 12/27/20 09:54      Lab Flowsheet    Order Details    View Encounter    Lab and Collection Details    Routing    Result History    View Encounter Conversation      CM=Additional comments      Result Care Coordination   Patient Communication   Add Comments   Seen Back to Top       Other Results from 12/27/2020  Lactate dehydrogenase Order: [id number] Status: Final result    Visible to patient: Yes (seen)    Next appt: 06/26/2021 at 09:45 AM in Oncology ([facility])    Dx: PV (polycythemia vera) ([place])    0 Result Notes           Component Ref Range & Units 5 mo ago (12/27/20) 11 mo ago (06/30/20) 1 yr ago (12/29/19) 2 yr ago (03/31/19) 2 yr ago (12/30/18) 2 yr ago (07/01/18) 3 yr ago (12/31/17)  LDH 98 - 192 U/L 169  185 CM  194 High  CM  182 CM  163 CM  163 CM  176 CM   Comment: Performed at [facility], [address] [place], [place] [address]  Resulting Agency  [facility] [place] [facility] [place] [facility] [place] [facility] [place] [facility] [place] [facility] [place] [facility]         Specimen Collected: 12/27/20 09:43 Last Resulted: 12/27/20 10:07      Lab Flowsheet    Order Details    View Encounter    Lab and Collection Details    Routing    Result History    View Encounter Conversation      CM=Additional comments      Result Care Coordination   Patient Communication   Add Comments   Seen Back to Top          Contains abnormal data Comprehensive metabolic panel Order: [id number] Status: Final result    Visible to patient: Yes (seen)    Next appt: 06/26/2021 at 09:45 AM in Oncology ([facility])    Dx: PV (polycythemia vera) ([place])    0 Result Notes           Component Ref Range & Units 5 mo  ago (12/27/20) 8 mo ago (09/30/20) 11 mo ago (06/30/20) 1 yr ago (03/28/20) 1 yr ago (12/29/19) 1 yr ago (09/29/19) 1 yr ago (06/30/19)  Sodium 135 - 145 mmol/L 136  139  141  138  139  137  138   Potassium 3.5 - 5.1 mmol/L 4.1  4.2  3.8  4.0  4.1  4.2  4.4   Chloride 98 - 111 mmol/L 103  102  103  98  103  103  103   CO2 22 - 32 mmol/L 29  30  29  27  27  27  31    Glucose, Bld 70 - 99 mg/dL 107 High   86 CM  92 CM  80 CM  90 CM  103 High  CM  105 High  CM   Comment: Glucose reference range applies only to samples taken after fasting for at least 8 hours.  BUN 6 - 20 mg/dL 19  11  16  14  17  11  13    Creatinine, Ser 0.44 - 1.00 mg/dL 0.79  0.75  0.75  0.73  0.69  0.77  0.74   Calcium 8.9 - 10.3 mg/dL 9.0  9.8  9.3  8.5 Low   8.7 Low   9.0  9.1   Total Protein 6.5 - 8.1 g/dL 6.5  7.5  7.3  7.3  7.0  6.9  6.8   Albumin 3.5 - 5.0 g/dL 4.1  4.5  4.5  4.4  4.4  4.2  4.1   AST 15 - 41 U/L 20  22  19  21  18  21  18    ALT 0 - 44 U/L 23  24  19  23  20  24  23    Alkaline Phosphatase 38 - 126 U/L 70  76  62  73  65  69  72   Total Bilirubin 0.3 - 1.2 mg/dL 0.6  0.6  0.6  0.8  0.6  0.6  0.5   GFR, Estimated >60 mL/min >60  >60 CM  >60 CM  >60 CM      Comment: (NOTE)  Calculated using the CKD-EPI Creatinine Equation (2021)   Anion gap 5 - 15 4 Low   7 CM  9 CM  13 CM  9 CM  7 CM  4 Low  CM   Comment: Performed at [facility], [address] [place], [place] [address]  Resulting Agency  [facility] [place] [facility] [place] [facility] [place] [facility] [place] [facility] [place] [facility] [place] [facility]         Specimen Collected: 12/27/20 09:43 Last Resulted: 12/27/20 10:07

## 2021-06-26 ENCOUNTER — Inpatient Hospital Stay: Payer: BC Managed Care – PPO

## 2021-06-26 ENCOUNTER — Encounter: Payer: Self-pay | Admitting: Internal Medicine

## 2021-06-26 ENCOUNTER — Other Ambulatory Visit: Payer: Self-pay

## 2021-06-26 ENCOUNTER — Inpatient Hospital Stay: Payer: BC Managed Care – PPO | Attending: Internal Medicine | Admitting: Internal Medicine

## 2021-06-26 VITALS — BP 130/63 | HR 77 | Temp 98.7°F | Resp 20 | Wt 222.8 lb

## 2021-06-26 DIAGNOSIS — Z87891 Personal history of nicotine dependence: Secondary | ICD-10-CM | POA: Diagnosis not present

## 2021-06-26 DIAGNOSIS — D45 Polycythemia vera: Secondary | ICD-10-CM | POA: Diagnosis not present

## 2021-06-26 DIAGNOSIS — Z803 Family history of malignant neoplasm of breast: Secondary | ICD-10-CM | POA: Diagnosis not present

## 2021-06-26 DIAGNOSIS — Z808 Family history of malignant neoplasm of other organs or systems: Secondary | ICD-10-CM | POA: Diagnosis not present

## 2021-06-26 DIAGNOSIS — Z1231 Encounter for screening mammogram for malignant neoplasm of breast: Secondary | ICD-10-CM

## 2021-06-26 LAB — CBC WITH DIFFERENTIAL/PLATELET
Abs Immature Granulocytes: 0.03 10*3/uL (ref 0.00–0.07)
Basophils Absolute: 0 10*3/uL (ref 0.0–0.1)
Basophils Relative: 0 %
Eosinophils Absolute: 0.1 10*3/uL (ref 0.0–0.5)
Eosinophils Relative: 1 %
HCT: 40.1 % (ref 36.0–46.0)
Hemoglobin: 14.1 g/dL (ref 12.0–15.0)
Immature Granulocytes: 1 %
Lymphocytes Relative: 24 %
Lymphs Abs: 1.1 10*3/uL (ref 0.7–4.0)
MCH: 37.7 pg — ABNORMAL HIGH (ref 26.0–34.0)
MCHC: 35.2 g/dL (ref 30.0–36.0)
MCV: 107.2 fL — ABNORMAL HIGH (ref 80.0–100.0)
Monocytes Absolute: 0.3 10*3/uL (ref 0.1–1.0)
Monocytes Relative: 7 %
Neutro Abs: 3 10*3/uL (ref 1.7–7.7)
Neutrophils Relative %: 67 %
Platelets: 195 10*3/uL (ref 150–400)
RBC: 3.74 MIL/uL — ABNORMAL LOW (ref 3.87–5.11)
RDW: 14 % (ref 11.5–15.5)
WBC: 4.5 10*3/uL (ref 4.0–10.5)
nRBC: 0 % (ref 0.0–0.2)

## 2021-06-26 LAB — COMPREHENSIVE METABOLIC PANEL
ALT: 22 U/L (ref 0–44)
AST: 20 U/L (ref 15–41)
Albumin: 4.2 g/dL (ref 3.5–5.0)
Alkaline Phosphatase: 74 U/L (ref 38–126)
Anion gap: 5 (ref 5–15)
BUN: 16 mg/dL (ref 6–20)
CO2: 27 mmol/L (ref 22–32)
Calcium: 8.9 mg/dL (ref 8.9–10.3)
Chloride: 103 mmol/L (ref 98–111)
Creatinine, Ser: 0.66 mg/dL (ref 0.44–1.00)
GFR, Estimated: >60 mL/min (ref >60)
Glucose, Bld: 102 mg/dL — ABNORMAL HIGH (ref 70–99)
Potassium: 3.9 mmol/L (ref 3.5–5.1)
Sodium: 135 mmol/L (ref 135–145)
Total Bilirubin: 0.8 mg/dL (ref 0.3–1.2)
Total Protein: 7.1 g/dL (ref 6.5–8.1)

## 2021-06-26 LAB — LACTATE DEHYDROGENASE: LDH: 163 U/L (ref 98–192)

## 2021-06-26 NOTE — Progress Notes (Signed)
[place] OFFICE PROGRESS NOTE  Patient Care Team: Kirk Ruths, MD as PCP - General (Internal Medicine)   Cancer Staging  No matching staging information was found for the patient.   Oncology History Overview Note  Diagnosis: Low- risk [id number]- positive Polycythemia Vera     HPI: Initially presented with elevated hemoglobin and white blood cell count in 2011 [rght foot second toe- black"- Dr.Kernodle; Dr.Pandit].  a. 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 b. 12/23/09: EPO level 2.5 extremely low, Lap score 77, Bcr-Abl -Carbon monoxide level 0.9 %, CLL profile: monoclonal b cell population, c. 12/27/09 : abd U/S: splenomegaly 20 cm. d. 12/29/09: bone marrow biopsy and aspiration: hypercellular marrow with trilineage hematopoiesis.  No malignancy or dysplasia identified in marrow specimen.  16 XY. Patient was started on phlebotomies, but hydroxyurea had to be initiated because of difficulties performing phlebotomies secondary to poor venous access. -----------------------------------    DIAGNOSIS: POLYCYTHEMIA VERA- JAK-2 +  GOALS: control  CURRENT/MOST RECENT THERAPY: Hydrea    PV (polycythemia vera) (HCC)     INTERVAL HISTORY:  Melissa Patrick 61 y.o.  female pleasant patient above history of Polycythemia vera currently on aspirin and Hydrea-is here for follow-up.  In the interim patient was evaluated by radiology had biopsy of the calcifications negative for malignancy.  She continues to be compliant with her Hydrea and aspirin.  Patient denies any blood clots.  Denies any headaches.  Denies any nausea vomiting.  No weight loss.  No diarrhea.  Review of Systems  Constitutional:  Negative for chills, diaphoresis, fever, malaise/fatigue and weight loss.  HENT:  Negative for nosebleeds and sore throat.   Eyes:  Negative for double vision.  Respiratory:  Negative for cough, hemoptysis, sputum production, shortness of  breath and wheezing.   Cardiovascular:  Negative for chest pain, palpitations, orthopnea and leg swelling.  Gastrointestinal:  Negative for abdominal pain, blood in stool, constipation, diarrhea, heartburn, melena, nausea and vomiting.  Genitourinary:  Negative for dysuria, frequency and urgency.  Musculoskeletal:  Negative for back pain and joint pain.  Skin: Negative.  Negative for itching and rash.  Neurological:  Negative for dizziness, tingling, focal weakness, weakness and headaches.  Endo/Heme/Allergies:  Does not bruise/bleed easily.  Psychiatric/Behavioral:  Negative for depression. The patient is not nervous/anxious and does not have insomnia.     PAST MEDICAL HISTORY :  Past Medical History:  Diagnosis Date   PV (polycythemia vera) ([place]) 09/08/2014    PAST SURGICAL HISTORY :   Past Surgical History:  Procedure Laterality Date   APPENDECTOMY     BREAST BIOPSY Right 01/11/2021   Stereo Bx, Ribbon Clip, Path Pending   TONSILLECTOMY      FAMILY HISTORY :   Family History  Problem Relation Age of Onset   Cancer Mother 53       breast   Arthritis Mother    Breast cancer Mother    Cancer Sister        skin   Stroke Maternal Uncle    Stroke Maternal Grandmother     SOCIAL HISTORY:   Social History   Tobacco Use   Smoking status: Former    Types: Cigarettes    Quit date: 02/23/2010    Years since quitting: 11.3   Smokeless tobacco: Never  Substance Use Topics   Alcohol use: Yes    Alcohol/week: 0.0 standard drinks    Comment: occasional about once a year at most   Drug  use: No    ALLERGIES:  is allergic to dextromoramide and penicillin g.  MEDICATIONS:  Current Outpatient Medications  Medication Sig Dispense Refill   aspirin 81 MG tablet Take 81 mg by mouth daily.     Cholecalciferol 50 MCG (2000 UT) TABS Take 2,000 Units by mouth daily.      hydroxyurea (HYDREA) 500 MG capsule May take with food to minimize GI side effects. 60 capsule 3   No current  facility-administered medications for this visit.    PHYSICAL EXAMINATION: ECOG PERFORMANCE STATUS: 0 - Asymptomatic  BP 130/63    Pulse 77    Temp 98.7 F (37.1 C)    Resp 20    Wt 222 lb 12.8 oz (101.1 kg)    SpO2 100%    BMI 35.96 kg/m   Filed Weights   06/26/21 0952  Weight: 222 lb 12.8 oz (101.1 kg)     Physical Exam HENT:     Head: Normocephalic and atraumatic.     Mouth/Throat:     Pharynx: No oropharyngeal exudate.  Eyes:     Pupils: Pupils are equal, round, and reactive to light.  Cardiovascular:     Rate and Rhythm: Normal rate and regular rhythm.  Pulmonary:     Effort: No respiratory distress.     Breath sounds: No wheezing.  Abdominal:     General: Bowel sounds are normal. There is no distension.     Palpations: Abdomen is soft. There is no mass.     Tenderness: There is no abdominal tenderness. There is no guarding or rebound.  Musculoskeletal:        General: No tenderness. Normal range of motion.     Cervical back: Normal range of motion and neck supple.  Skin:    General: Skin is warm.  Neurological:     Mental Status: She is alert and oriented to person, place, and time.  Psychiatric:        Mood and Affect: Affect normal.     LABORATORY DATA:  I have reviewed the data as listed    Component Value Date/Time   NA 135 06/26/2021 0941   NA 140 10/08/2013 1021   K 3.9 06/26/2021 0941   K 4.3 10/08/2013 1021   CL 103 06/26/2021 0941   CL 103 10/08/2013 1021   CO2 27 06/26/2021 0941   CO2 31 10/08/2013 1021   GLUCOSE 102 (H) 06/26/2021 0941   GLUCOSE 98 10/08/2013 1021   BUN 16 06/26/2021 0941   BUN 11 10/08/2013 1021   CREATININE 0.66 06/26/2021 0941   CREATININE 0.89 03/25/2014 0938   CALCIUM 8.9 06/26/2021 0941   CALCIUM 8.4 (L) 10/08/2013 1021   PROT 7.1 06/26/2021 0941   PROT 6.7 03/25/2014 0938   ALBUMIN 4.2 06/26/2021 0941   ALBUMIN 3.7 03/25/2014 0938   AST 20 06/26/2021 0941   AST 22 03/25/2014 0938   ALT 22 06/26/2021 0941    ALT 35 03/25/2014 0938   ALKPHOS 74 06/26/2021 0941   ALKPHOS 84 03/25/2014 0938   BILITOT 0.8 06/26/2021 0941   BILITOT 0.6 03/25/2014 0938   GFRNONAA >60 06/26/2021 0941   GFRNONAA >60 03/25/2014 0938   GFRNONAA >60 12/31/2013 0937   GFRAA >60 12/29/2019 0926   GFRAA >60 03/25/2014 0938   GFRAA >60 12/31/2013 0937    No results found for: SPEP, UPEP  Lab Results  Component Value Date   WBC 4.5 06/26/2021   NEUTROABS 3.0 06/26/2021   HGB 14.1 06/26/2021  HCT 40.1 06/26/2021   MCV 107.2 (H) 06/26/2021   PLT 195 06/26/2021      Chemistry      Component Value Date/Time   NA 135 06/26/2021 0941   NA 140 10/08/2013 1021   K 3.9 06/26/2021 0941   K 4.3 10/08/2013 1021   CL 103 06/26/2021 0941   CL 103 10/08/2013 1021   CO2 27 06/26/2021 0941   CO2 31 10/08/2013 1021   BUN 16 06/26/2021 0941   BUN 11 10/08/2013 1021   CREATININE 0.66 06/26/2021 0941   CREATININE 0.89 03/25/2014 0938      Component Value Date/Time   CALCIUM 8.9 06/26/2021 0941   CALCIUM 8.4 (L) 10/08/2013 1021   ALKPHOS 74 06/26/2021 0941   ALKPHOS 84 03/25/2014 0938   AST 20 06/26/2021 0941   AST 22 03/25/2014 0938   ALT 22 06/26/2021 0941   ALT 35 03/25/2014 0938   BILITOT 0.8 06/26/2021 0941   BILITOT 0.6 03/25/2014 0938       RADIOGRAPHIC STUDIES: I have personally reviewed the radiological images as listed and agreed with the findings in the report. No results found.   ASSESSMENT & PLAN:  PV (polycythemia vera) ([place]) # Polycythemia vera; JAK-2 positive Today hematocrit is 43. Patient is on Hydrea [2 every days; 1 on sat/sun] and aspirin. STABLE.  No concerns for transformation.  # Tolerating Hydrea well; continue Hydrea at current dose.  # Breast Calcification: on Diagnostic in Jan 2022; September 2022 biopsy negative for malignancy.  Resume screening mammograms  # DISPOSITION: # mammogram BIL screening March 2023 # follow up in 6 months- MD; labs- cbc/cmp;LDH-dr.B   No orders  of the defined types were placed in this encounter.  All questions were answered. The patient knows to call the clinic with any problems, questions or concerns.      Cammie Sickle, MD 06/26/2021 10:24 AM

## 2021-06-26 NOTE — Assessment & Plan Note (Addendum)
#   Polycythemia vera; JAK-2 positive Today hematocrit is 43. Patient is on Hydrea [2 every days; 1 on sat/sun] and aspirin. STABLE.  No concerns for transformation. ? ?# Tolerating Hydrea well; continue Hydrea at current dose. ? ?# Breast Calcification: on Diagnostic in Jan 2022; September 2022 biopsy negative for malignancy.  Resume screening mammograms ? ?# DISPOSITION: ?# mammogram BIL screening March 2023 ?# follow up in 6 months- MD; labs- cbc/cmp;LDH-dr.B ? ?

## 2021-06-26 NOTE — Addendum Note (Signed)
Addended by: Vanice Sarah on: 06/26/2021 10:28 AM ? ? Modules accepted: Orders ? ?

## 2021-08-02 ENCOUNTER — Ambulatory Visit
Admission: RE | Admit: 2021-08-02 | Discharge: 2021-08-02 | Disposition: A | Discharge status: Home / Self Care | Payer: BC Managed Care – PPO | Source: Ambulatory Visit | Attending: Internal Medicine | Admitting: Internal Medicine

## 2021-08-02 DIAGNOSIS — Z1231 Encounter for screening mammogram for malignant neoplasm of breast: Secondary | ICD-10-CM | POA: Insufficient documentation

## 2021-11-08 ENCOUNTER — Other Ambulatory Visit: Payer: Self-pay | Admitting: Internal Medicine

## 2021-11-08 DIAGNOSIS — D45 Polycythemia vera: Secondary | ICD-10-CM

## 2021-11-08 NOTE — Telephone Encounter (Signed)
CBC w/auto Differential (5 Part) Order: [id number]  Ref Range & Units 13 d ago  WBC (White Blood Cell Count) 4.1 - 10.2 10^3/uL 5.2   RBC (Red Blood Cell Count) 4.04 - 5.48 10^6/uL 3.69 Low    Hemoglobin 12.0 - 15.0 gm/dL 14.3   Hematocrit 35.0 - 47.0 % 41.2   MCV (Mean Corpuscular Volume) 80.0 - 100.0 fl 111.7 High    Comment: Smear review agrees with analyzer results  MCH (Mean Corpuscular Hemoglobin) 27.0 - 31.2 pg 38.8 High    MCHC (Mean Corpuscular Hemoglobin Concentration) 32.0 - 36.0 gm/dL 34.7   Platelet Count 150 - 450 10^3/uL 144 Low    RDW-CV (Red Cell Distribution Width) 11.6 - 14.8 % 10.9 Low    MPV (Mean Platelet Volume) 9.4 - 12.4 fl 11.6   Neutrophils 1.50 - 7.80 10^3/uL 3.34   Lymphocytes 1.00 - 3.60 10^3/uL 1.39   Monocytes 0.00 - 1.50 10^3/uL 0.40   Eosinophils 0.00 - 0.55 10^3/uL 0.06   Basophils 0.00 - 0.09 10^3/uL 0.02   Neutrophil % 32.0 - 70.0 % 64.0   Lymphocyte % 10.0 - 50.0 % 26.6   Monocyte % 4.0 - 13.0 % 7.7   Eosinophil % 1.0 - 5.0 % 1.1   Basophil% 0.0 - 2.0 % 0.4   Immature Granulocyte % <=0.7 % 0.2   Immature Granulocyte Count <=0.06 10^3/L 0.01   Resulting Agency  [facility]   Specimen Collected: 10/26/21 14:42   Performed by: [facility] Last Resulted: 10/26/21 16:53  Received From: [facility]  Result Received: 11/08/21 09:57  Comprehensive Metabolic Panel (CMP) Order: [id number]  Ref Range & Units 13 d ago  Glucose 70 - 110 mg/dL 94   Sodium 136 - 145 mmol/L 141   Potassium 3.6 - 5.1 mmol/L 4.4   Chloride 97 - 109 mmol/L 103   Carbon Dioxide (CO2) 22.0 - 32.0 mmol/L 28.6   Urea Nitrogen (BUN) 7 - 25 mg/dL 11   Creatinine 0.6 - 1.1 mg/dL 0.8   Glomerular Filtration Rate (eGFR), MDRD Estimate >60 mL/min/1.73sq m 74   Calcium 8.7 - 10.3 mg/dL 9.7   AST  8 - 39 U/L 19   ALT  5 - 38 U/L 18   Alk Phos (alkaline Phosphatase) 34 - 104 U/L 81   Albumin 3.5 - 4.8 g/dL 4.7   Bilirubin, Total 0.3 -  1.2 mg/dL 0.6   Protein, Total 6.1 - 7.9 g/dL 6.8   A/G Ratio 1.0 - 5.0 gm/dL 2.2   Resulting Agency  [place] [facility]   Specimen Collected: 10/26/21 14:42   Performed by: [facility] Last Resulted: 10/27/21 10:05  Received From: [place]  Result Received: 11/08/21 09:57

## 2021-12-25 ENCOUNTER — Inpatient Hospital Stay: Payer: BC Managed Care – PPO | Attending: Internal Medicine

## 2021-12-25 ENCOUNTER — Inpatient Hospital Stay: Payer: BC Managed Care – PPO | Admitting: Internal Medicine

## 2021-12-25 ENCOUNTER — Encounter: Payer: Self-pay | Admitting: Internal Medicine

## 2021-12-25 DIAGNOSIS — D45 Polycythemia vera: Secondary | ICD-10-CM

## 2021-12-25 DIAGNOSIS — Z803 Family history of malignant neoplasm of breast: Secondary | ICD-10-CM | POA: Insufficient documentation

## 2021-12-25 DIAGNOSIS — Z87891 Personal history of nicotine dependence: Secondary | ICD-10-CM | POA: Diagnosis not present

## 2021-12-25 LAB — COMPREHENSIVE METABOLIC PANEL
ALT: 20 U/L (ref 0–44)
AST: 20 U/L (ref 15–41)
Albumin: 4.1 g/dL (ref 3.5–5.0)
Alkaline Phosphatase: 78 U/L (ref 38–126)
Anion gap: 6 (ref 5–15)
BUN: 16 mg/dL (ref 6–20)
CO2: 29 mmol/L (ref 22–32)
Calcium: 9.1 mg/dL (ref 8.9–10.3)
Chloride: 105 mmol/L (ref 98–111)
Creatinine, Ser: 0.81 mg/dL (ref 0.44–1.00)
GFR, Estimated: >60 mL/min (ref >60)
Glucose, Bld: 129 mg/dL — ABNORMAL HIGH (ref 70–99)
Potassium: 3.8 mmol/L (ref 3.5–5.1)
Sodium: 140 mmol/L (ref 135–145)
Total Bilirubin: 0.6 mg/dL (ref 0.3–1.2)
Total Protein: 6.9 g/dL (ref 6.5–8.1)

## 2021-12-25 LAB — CBC WITH DIFFERENTIAL/PLATELET
Abs Immature Granulocytes: 0.01 10*3/uL (ref 0.00–0.07)
Basophils Absolute: 0 10*3/uL (ref 0.0–0.1)
Basophils Relative: 0 %
Eosinophils Absolute: 0 10*3/uL (ref 0.0–0.5)
Eosinophils Relative: 1 %
HCT: 40.4 % (ref 36.0–46.0)
Hemoglobin: 14.3 g/dL (ref 12.0–15.0)
Immature Granulocytes: 0 %
Lymphocytes Relative: 25 %
Lymphs Abs: 1.1 10*3/uL (ref 0.7–4.0)
MCH: 38.3 pg — ABNORMAL HIGH (ref 26.0–34.0)
MCHC: 35.4 g/dL (ref 30.0–36.0)
MCV: 108.3 fL — ABNORMAL HIGH (ref 80.0–100.0)
Monocytes Absolute: 0.2 10*3/uL (ref 0.1–1.0)
Monocytes Relative: 5 %
Neutro Abs: 2.9 10*3/uL (ref 1.7–7.7)
Neutrophils Relative %: 69 %
Platelets: 195 10*3/uL (ref 150–400)
RBC: 3.73 MIL/uL — ABNORMAL LOW (ref 3.87–5.11)
RDW: 12.1 % (ref 11.5–15.5)
WBC: 4.2 10*3/uL (ref 4.0–10.5)
nRBC: 0 % (ref 0.0–0.2)

## 2021-12-25 LAB — LACTATE DEHYDROGENASE: LDH: 152 U/L (ref 98–192)

## 2021-12-25 NOTE — Progress Notes (Signed)
No concerns. 

## 2021-12-25 NOTE — Addendum Note (Signed)
Addended by: Leeann Must on: 12/25/2021 12:03 PM   Modules accepted: Orders

## 2021-12-25 NOTE — Assessment & Plan Note (Addendum)
#   Polycythemia vera; JAK-2 positive Today hematocrit is 43. Patient is on Hydrea [2 every days; 1 on sat/sun] and aspirin. STABLE.  No concerns for transformation.  # Tolerating Hydrea well; continue Hydrea at current dose.  # Breast Calcification: on Diagnostic in Jan 2022; September 2022 biopsy negative for malignancy. APRIL 2023- WNL; continue screening mammograms  # DISPOSITION: # mammogram BIL screening APRIL 2024 # follow up in 6 months- MD; labs- cbc/cmp;LDH-dr.B

## 2021-12-25 NOTE — Progress Notes (Signed)
[facility] OFFICE PROGRESS NOTE  Patient Care Team: Kirk Ruths, MD as PCP - General (Internal Medicine)   Cancer Staging  No matching staging information was found for the patient.   Oncology History Overview Note  Diagnosis: Low- risk [id number]- positive Polycythemia Vera     HPI: Initially presented with elevated hemoglobin and white blood cell count in 2011 [rght foot second toe- black"- Dr.Kernodle; Dr.Pandit].  a. 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 b. 12/23/09: EPO level 2.5 extremely low, Lap score 77, Bcr-Abl -Carbon monoxide level 0.9 %, CLL profile: monoclonal b cell population, c. 12/27/09 : abd U/S: splenomegaly 20 cm. d. 12/29/09: bone marrow biopsy and aspiration: hypercellular marrow with trilineage hematopoiesis.  No malignancy or dysplasia identified in marrow specimen.  95 XY. Patient was started on phlebotomies, but hydroxyurea had to be initiated because of difficulties performing phlebotomies secondary to poor venous access. -----------------------------------    DIAGNOSIS: POLYCYTHEMIA VERA- JAK-2 +  GOALS: control  CURRENT/MOST RECENT THERAPY: Hydrea    PV (polycythemia vera) (HCC)    INTERVAL HISTORY: Alone.  Ambulating independently.  Melissa Patrick 61 y.o.  female pleasant patient above history of Polycythemia vera currently on aspirin and Hydrea-is here for follow-up.   She continues to be compliant with her Hydrea and aspirin.  Patient denies any blood clots.  Denies any headaches.  Denies any nausea vomiting.  No weight loss.  No diarrhea.  Review of Systems  Constitutional:  Negative for chills, diaphoresis, fever, malaise/fatigue and weight loss.  HENT:  Negative for nosebleeds and sore throat.   Eyes:  Negative for double vision.  Respiratory:  Negative for cough, hemoptysis, sputum production, shortness of breath and wheezing.   Cardiovascular:  Negative for chest pain, palpitations,  orthopnea and leg swelling.  Gastrointestinal:  Negative for abdominal pain, blood in stool, constipation, diarrhea, heartburn, melena, nausea and vomiting.  Genitourinary:  Negative for dysuria, frequency and urgency.  Musculoskeletal:  Negative for back pain and joint pain.  Skin: Negative.  Negative for itching and rash.  Neurological:  Negative for dizziness, tingling, focal weakness, weakness and headaches.  Endo/Heme/Allergies:  Does not bruise/bleed easily.  Psychiatric/Behavioral:  Negative for depression. The patient is not nervous/anxious and does not have insomnia.      PAST MEDICAL HISTORY :  Past Medical History:  Diagnosis Date  . PV (polycythemia vera) ([place]) 09/08/2014    PAST SURGICAL HISTORY :   Past Surgical History:  Procedure Laterality Date  . APPENDECTOMY    . BREAST BIOPSY Right 01/11/2021   Stereo Bx, Ribbon Clip, SCLEROSING ADENOSIS AND FIBROCYSTIC CHANGES  . TONSILLECTOMY      FAMILY HISTORY :   Family History  Problem Relation Age of Onset  . Cancer Mother 37       breast  . Arthritis Mother   . Breast cancer Mother   . Cancer Sister        skin  . Stroke Maternal Uncle   . Stroke Maternal Grandmother     SOCIAL HISTORY:   Social History   Tobacco Use  . Smoking status: Former    Types: Cigarettes    Quit date: 02/23/2010    Years since quitting: 11.8  . Smokeless tobacco: Never  Substance Use Topics  . Alcohol use: Yes    Alcohol/week: 0.0 standard drinks of alcohol    Comment: occasional about once a year at most  . Drug use: No    ALLERGIES:  is allergic to dextromoramide, penicillin g, and tegaderm ag mesh [silver].  MEDICATIONS:  Current Outpatient Medications  Medication Sig Dispense Refill  . aspirin 81 MG tablet Take 81 mg by mouth daily.    . Cholecalciferol 50 MCG (2000 UT) TABS Take 2,000 Units by mouth daily.     . hydroxyurea (HYDREA) 500 MG capsule TAKE 2 CAPSULES BY MOUTH DAILY FROM MONDAY TO FRIDAY, AND 1 CAPSULE  DAILY ON SATURDAY AND SUNDAY 60 capsule 3   No current facility-administered medications for this visit.    PHYSICAL EXAMINATION: ECOG PERFORMANCE STATUS: 0 - Asymptomatic  BP 136/83 (BP Location: Left Arm, Patient Position: Sitting, Cuff Size: Normal)   Pulse 74   Temp (!) 97.4 F (36.3 C) (Tympanic)   Ht _0  (1.676 m)   Wt 223 lb 3.2 oz (101.2 kg)   SpO2 99%   BMI 36.03 kg/m   Filed Weights   12/25/21 1022  Weight: 223 lb 3.2 oz (101.2 kg)     Physical Exam HENT:     Head: Normocephalic and atraumatic.     Mouth/Throat:     Pharynx: No oropharyngeal exudate.  Eyes:     Pupils: Pupils are equal, round, and reactive to light.  Cardiovascular:     Rate and Rhythm: Normal rate and regular rhythm.  Pulmonary:     Effort: No respiratory distress.     Breath sounds: No wheezing.  Abdominal:     General: Bowel sounds are normal. There is no distension.     Palpations: Abdomen is soft. There is no mass.     Tenderness: There is no abdominal tenderness. There is no guarding or rebound.  Musculoskeletal:        General: No tenderness. Normal range of motion.     Cervical back: Normal range of motion and neck supple.  Skin:    General: Skin is warm.  Neurological:     Mental Status: She is alert and oriented to person, place, and time.  Psychiatric:        Mood and Affect: Affect normal.     LABORATORY DATA:  I have reviewed the data as listed    Component Value Date/Time   NA 140 12/25/2021 0958   NA 140 10/08/2013 1021   K 3.8 12/25/2021 0958   K 4.3 10/08/2013 1021   CL 105 12/25/2021 0958   CL 103 10/08/2013 1021   CO2 29 12/25/2021 0958   CO2 31 10/08/2013 1021   GLUCOSE 129 (H) 12/25/2021 0958   GLUCOSE 98 10/08/2013 1021   BUN 16 12/25/2021 0958   BUN 11 10/08/2013 1021   CREATININE 0.81 12/25/2021 0958   CREATININE 0.89 03/25/2014 0938   CALCIUM 9.1 12/25/2021 0958   CALCIUM 8.4 (L) 10/08/2013 1021   PROT 6.9 12/25/2021 0958   PROT 6.7  03/25/2014 0938   ALBUMIN 4.1 12/25/2021 0958   ALBUMIN 3.7 03/25/2014 0938   AST 20 12/25/2021 0958   AST 22 03/25/2014 0938   ALT 20 12/25/2021 0958   ALT 35 03/25/2014 0938   ALKPHOS 78 12/25/2021 0958   ALKPHOS 84 03/25/2014 0938   BILITOT 0.6 12/25/2021 0958   BILITOT 0.6 03/25/2014 0938   GFRNONAA >60 12/25/2021 0958   GFRNONAA >60 03/25/2014 0938   GFRNONAA >60 12/31/2013 0937   GFRAA >60 12/29/2019 0926   GFRAA >60 03/25/2014 0938   GFRAA >60 12/31/2013 0937    No results found for: "SPEP", "UPEP"  Lab Results  Component Value Date  WBC 4.2 12/25/2021   NEUTROABS 2.9 12/25/2021   HGB 14.3 12/25/2021   HCT 40.4 12/25/2021   MCV 108.3 (H) 12/25/2021   PLT 195 12/25/2021      Chemistry      Component Value Date/Time   NA 140 12/25/2021 0958   NA 140 10/08/2013 1021   K 3.8 12/25/2021 0958   K 4.3 10/08/2013 1021   CL 105 12/25/2021 0958   CL 103 10/08/2013 1021   CO2 29 12/25/2021 0958   CO2 31 10/08/2013 1021   BUN 16 12/25/2021 0958   BUN 11 10/08/2013 1021   CREATININE 0.81 12/25/2021 0958   CREATININE 0.89 03/25/2014 0938      Component Value Date/Time   CALCIUM 9.1 12/25/2021 0958   CALCIUM 8.4 (L) 10/08/2013 1021   ALKPHOS 78 12/25/2021 0958   ALKPHOS 84 03/25/2014 0938   AST 20 12/25/2021 0958   AST 22 03/25/2014 0938   ALT 20 12/25/2021 0958   ALT 35 03/25/2014 0938   BILITOT 0.6 12/25/2021 0958   BILITOT 0.6 03/25/2014 0938       RADIOGRAPHIC STUDIES: I have personally reviewed the radiological images as listed and agreed with the findings in the report. No results found.   ASSESSMENT & PLAN:  PV (polycythemia vera) ([place]) # Polycythemia vera; JAK-2 positive Today hematocrit is 43. Patient is on Hydrea [2 every days; 1 on sat/sun] and aspirin. STABLE.  No concerns for transformation.  # Tolerating Hydrea well; continue Hydrea at current dose.  # Breast Calcification: on Diagnostic in Jan 2022; September 2022 biopsy negative for  malignancy. APRIL 2023- WNL; continue screening mammograms  # DISPOSITION: # mammogram BIL screening APRIL 2024 # follow up in 6 months- MD; labs- cbc/cmp;LDH-dr.B   No orders of the defined types were placed in this encounter.  All questions were answered. The patient knows to call the clinic with any problems, questions or concerns.      Cammie Sickle, MD 12/25/2021 11:48 AM

## 2022-06-04 ENCOUNTER — Other Ambulatory Visit: Payer: Self-pay | Admitting: Internal Medicine

## 2022-06-04 ENCOUNTER — Ambulatory Visit
Admission: RE | Admit: 2022-06-04 | Discharge: 2022-06-04 | Disposition: A | Discharge status: Home / Self Care | Payer: BC Managed Care – PPO | Source: Ambulatory Visit | Attending: Internal Medicine | Admitting: Internal Medicine

## 2022-06-04 DIAGNOSIS — R1031 Right lower quadrant pain: Secondary | ICD-10-CM | POA: Diagnosis not present

## 2022-06-11 ENCOUNTER — Other Ambulatory Visit: Payer: Self-pay | Admitting: Medical Oncology

## 2022-06-11 DIAGNOSIS — D45 Polycythemia vera: Secondary | ICD-10-CM

## 2022-06-11 MED ORDER — HYDROXYUREA 500 MG PO CAPS: ORAL_CAPSULE | ORAL | 3 refills | Status: DC

## 2022-06-26 ENCOUNTER — Inpatient Hospital Stay: Payer: BC Managed Care – PPO | Attending: Internal Medicine

## 2022-06-26 ENCOUNTER — Inpatient Hospital Stay (HOSPITAL_BASED_OUTPATIENT_CLINIC_OR_DEPARTMENT_OTHER): Payer: BC Managed Care – PPO | Admitting: Internal Medicine

## 2022-06-26 ENCOUNTER — Encounter: Payer: Self-pay | Admitting: Internal Medicine

## 2022-06-26 VITALS — BP 115/79 | HR 77 | Temp 97.6°F | Resp 18 | Wt 223.8 lb

## 2022-06-26 DIAGNOSIS — Z87891 Personal history of nicotine dependence: Secondary | ICD-10-CM | POA: Insufficient documentation

## 2022-06-26 DIAGNOSIS — D45 Polycythemia vera: Secondary | ICD-10-CM

## 2022-06-26 DIAGNOSIS — R300 Dysuria: Secondary | ICD-10-CM | POA: Insufficient documentation

## 2022-06-26 DIAGNOSIS — Z803 Family history of malignant neoplasm of breast: Secondary | ICD-10-CM | POA: Diagnosis not present

## 2022-06-26 LAB — CBC WITH DIFFERENTIAL/PLATELET
Abs Immature Granulocytes: 0.02 10*3/uL (ref 0.00–0.07)
Basophils Absolute: 0 10*3/uL (ref 0.0–0.1)
Basophils Relative: 1 %
Eosinophils Absolute: 0 10*3/uL (ref 0.0–0.5)
Eosinophils Relative: 1 %
HCT: 39.3 % (ref 36.0–46.0)
Hemoglobin: 13.5 g/dL (ref 12.0–15.0)
Immature Granulocytes: 1 %
Lymphocytes Relative: 20 %
Lymphs Abs: 0.9 10*3/uL (ref 0.7–4.0)
MCH: 35.9 pg — ABNORMAL HIGH (ref 26.0–34.0)
MCHC: 34.4 g/dL (ref 30.0–36.0)
MCV: 104.5 fL — ABNORMAL HIGH (ref 80.0–100.0)
Monocytes Absolute: 0.3 10*3/uL (ref 0.1–1.0)
Monocytes Relative: 6 %
Neutro Abs: 3 10*3/uL (ref 1.7–7.7)
Neutrophils Relative %: 71 %
Platelets: 218 10*3/uL (ref 150–400)
RBC: 3.76 MIL/uL — ABNORMAL LOW (ref 3.87–5.11)
RDW: 13.2 % (ref 11.5–15.5)
WBC: 4.3 10*3/uL (ref 4.0–10.5)
nRBC: 0 % (ref 0.0–0.2)

## 2022-06-26 LAB — COMPREHENSIVE METABOLIC PANEL
ALT: 18 U/L (ref 0–44)
AST: 21 U/L (ref 15–41)
Albumin: 4.3 g/dL (ref 3.5–5.0)
Alkaline Phosphatase: 65 U/L (ref 38–126)
Anion gap: 6 (ref 5–15)
BUN: 14 mg/dL (ref 6–20)
CO2: 28 mmol/L (ref 22–32)
Calcium: 9.1 mg/dL (ref 8.9–10.3)
Chloride: 106 mmol/L (ref 98–111)
Creatinine, Ser: 0.77 mg/dL (ref 0.44–1.00)
GFR, Estimated: >60 mL/min (ref >60)
Glucose, Bld: 108 mg/dL — ABNORMAL HIGH (ref 70–99)
Potassium: 3.9 mmol/L (ref 3.5–5.1)
Sodium: 140 mmol/L (ref 135–145)
Total Bilirubin: 0.6 mg/dL (ref 0.3–1.2)
Total Protein: 6.9 g/dL (ref 6.5–8.1)

## 2022-06-26 LAB — LACTATE DEHYDROGENASE: LDH: 179 U/L (ref 98–192)

## 2022-06-26 NOTE — Assessment & Plan Note (Addendum)
#   Polycythemia vera; JAK-2 positive Today hematocrit is 43. Patient is on Hydrea [2 every days; 1 on sat/sun] and aspirin. Stable.  No concerns for transformation.  # Tolerating Hydrea well; continue Hydrea at current dose.  #  CT scan [left flank pain/dysuria; FEB 2024]-? Passed stone; incidental Borderline enlarged, 13 cm in greatest dimension. No splenic mass or focal lesion.monitor for now.    # Breast Calcification: on Diagnostic in Jan 2022; September 2022 biopsy negative for malignancy. APRIL 2023- WNL; continue screening mammograms  # DISPOSITION: # follow up in 6 months- MD; labs- cbc/cmp;LDH-dr.B

## 2022-06-26 NOTE — Progress Notes (Signed)
3 weeks ago patient was having abdominal pain and CT ordered by PCP showed enlarged spleen.

## 2022-06-26 NOTE — Progress Notes (Signed)
[facility] OFFICE PROGRESS NOTE  Patient Care Team: Kirk Ruths, MD as PCP - General (Internal Medicine) Cammie Sickle, MD as Consulting Physician (Internal Medicine)   Cancer Staging  No matching staging information was found for the patient.   Oncology History Overview Note  Diagnosis: Low- risk [id number] positive Polycythemia Vera     HPI: Initially presented with elevated hemoglobin and white blood cell count in 2011 [rght foot second toe- black"- Dr.Kernodle; Dr.Pandit].  a. 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 b. 12/23/09: EPO level 2.5 extremely low, Lap score 77, Bcr-Abl -Carbon monoxide level 0.9 %, CLL profile: monoclonal b cell population, c. 12/27/09 : abd U/S: splenomegaly 20 cm. d. 12/29/09: bone marrow biopsy and aspiration: hypercellular marrow with trilineage hematopoiesis.  No malignancy or dysplasia identified in marrow specimen.  24 XY. Patient was started on phlebotomies, but hydroxyurea had to be initiated because of difficulties performing phlebotomies secondary to poor venous access. -----------------------------------    DIAGNOSIS: POLYCYTHEMIA VERA- JAK-2 +  GOALS: control  CURRENT/MOST RECENT THERAPY: Hydrea    PV (polycythemia vera) (HCC)    INTERVAL HISTORY: Alone.  Ambulating independently.  Melissa Patrick 62 y.o.  female pleasant patient above history of Polycythemia vera currently on aspirin and Hydrea-is here for follow-up.   She continues to be compliant with her Hydrea and aspirin.  3 weeks ago patient was having abdominal pain and CT ordered by PCP showed enlarged spleen.   Patient denies any blood clots.  Denies any headaches.  Denies any nausea vomiting. No weight loss.  No diarrhea.  Review of Systems  Constitutional:  Negative for chills, diaphoresis, fever, malaise/fatigue and weight loss.  HENT:  Negative for nosebleeds and sore throat.   Eyes:  Negative for double  vision.  Respiratory:  Negative for cough, hemoptysis, sputum production, shortness of breath and wheezing.   Cardiovascular:  Negative for chest pain, palpitations, orthopnea and leg swelling.  Gastrointestinal:  Negative for abdominal pain, blood in stool, constipation, diarrhea, heartburn, melena, nausea and vomiting.  Genitourinary:  Negative for dysuria, frequency and urgency.  Musculoskeletal:  Negative for back pain and joint pain.  Skin: Negative.  Negative for itching and rash.  Neurological:  Negative for dizziness, tingling, focal weakness, weakness and headaches.  Endo/Heme/Allergies:  Does not bruise/bleed easily.  Psychiatric/Behavioral:  Negative for depression. The patient is not nervous/anxious and does not have insomnia.      PAST MEDICAL HISTORY :  Past Medical History:  Diagnosis Date   PV (polycythemia vera) ([place]) 09/08/2014    PAST SURGICAL HISTORY :   Past Surgical History:  Procedure Laterality Date   APPENDECTOMY     BREAST BIOPSY Right 01/11/2021   Stereo Bx, Ribbon Clip, SCLEROSING ADENOSIS AND FIBROCYSTIC CHANGES   TONSILLECTOMY      FAMILY HISTORY :   Family History  Problem Relation Age of Onset   Cancer Mother 15       breast   Arthritis Mother    Breast cancer Mother    Cancer Sister        skin   Stroke Maternal Uncle    Stroke Maternal Grandmother     SOCIAL HISTORY:   Social History   Tobacco Use   Smoking status: Former    Types: Cigarettes    Quit date: 02/23/2010    Years since quitting: 12.3   Smokeless tobacco: Never  Substance Use Topics   Alcohol use: Yes    Alcohol/week:  0.0 standard drinks of alcohol    Comment: occasional about once a year at most   Drug use: No    ALLERGIES:  is allergic to dextromoramide, penicillin g, and tegaderm ag mesh [silver].  MEDICATIONS:  Current Outpatient Medications  Medication Sig Dispense Refill   aspirin 81 MG tablet Take 81 mg by mouth daily.     Cholecalciferol 50 MCG (2000  UT) TABS Take 2,000 Units by mouth daily.      hydroxyurea (HYDREA) 500 MG capsule TAKE 2 CAPSULES BY MOUTH DAILY FROM MONDAY TO FRIDAY, AND 1 CAPSULE DAILY ON SATURDAY AND SUNDAY 60 capsule 3   No current facility-administered medications for this visit.    PHYSICAL EXAMINATION: ECOG PERFORMANCE STATUS: 0 - Asymptomatic  BP 115/79 (BP Location: Left Arm, Patient Position: Sitting)   Pulse 77   Temp 97.6 F (36.4 C) (Tympanic)   Resp 18   Wt 223 lb 12.8 oz (101.5 kg)   BMI 36.12 kg/m   Filed Weights   06/26/22 1400  Weight: 223 lb 12.8 oz (101.5 kg)      Physical Exam HENT:     Head: Normocephalic and atraumatic.     Mouth/Throat:     Pharynx: No oropharyngeal exudate.  Eyes:     Pupils: Pupils are equal, round, and reactive to light.  Cardiovascular:     Rate and Rhythm: Normal rate and regular rhythm.  Pulmonary:     Effort: No respiratory distress.     Breath sounds: No wheezing.  Abdominal:     General: Bowel sounds are normal. There is no distension.     Palpations: Abdomen is soft. There is no mass.     Tenderness: There is no abdominal tenderness. There is no guarding or rebound.  Musculoskeletal:        General: No tenderness. Normal range of motion.     Cervical back: Normal range of motion and neck supple.  Skin:    General: Skin is warm.  Neurological:     Mental Status: She is alert and oriented to person, place, and time.  Psychiatric:        Mood and Affect: Affect normal.      LABORATORY DATA:  I have reviewed the data as listed    Component Value Date/Time   NA 140 06/26/2022 1349   NA 140 10/08/2013 1021   K 3.9 06/26/2022 1349   K 4.3 10/08/2013 1021   CL 106 06/26/2022 1349   CL 103 10/08/2013 1021   CO2 28 06/26/2022 1349   CO2 31 10/08/2013 1021   GLUCOSE 108 (H) 06/26/2022 1349   GLUCOSE 98 10/08/2013 1021   BUN 14 06/26/2022 1349   BUN 11 10/08/2013 1021   CREATININE 0.77 06/26/2022 1349   CREATININE 0.89 03/25/2014 0938    CALCIUM 9.1 06/26/2022 1349   CALCIUM 8.4 (L) 10/08/2013 1021   PROT 6.9 06/26/2022 1349   PROT 6.7 03/25/2014 0938   ALBUMIN 4.3 06/26/2022 1349   ALBUMIN 3.7 03/25/2014 0938   AST 21 06/26/2022 1349   AST 22 03/25/2014 0938   ALT 18 06/26/2022 1349   ALT 35 03/25/2014 0938   ALKPHOS 65 06/26/2022 1349   ALKPHOS 84 03/25/2014 0938   BILITOT 0.6 06/26/2022 1349   BILITOT 0.6 03/25/2014 0938   GFRNONAA >60 06/26/2022 1349   GFRNONAA >60 03/25/2014 0938   GFRNONAA >60 12/31/2013 0937   GFRAA >60 12/29/2019 0926   GFRAA >60 03/25/2014 0938   GFRAA >60 12/31/2013  [id number]    No results found for: "SPEP", "UPEP"  Lab Results  Component Value Date   WBC 4.3 06/26/2022   NEUTROABS 3.0 06/26/2022   HGB 13.5 06/26/2022   HCT 39.3 06/26/2022   MCV 104.5 (H) 06/26/2022   PLT 218 06/26/2022      Chemistry      Component Value Date/Time   NA 140 06/26/2022 1349   NA 140 10/08/2013 1021   K 3.9 06/26/2022 1349   K 4.3 10/08/2013 1021   CL 106 06/26/2022 1349   CL 103 10/08/2013 1021   CO2 28 06/26/2022 1349   CO2 31 10/08/2013 1021   BUN 14 06/26/2022 1349   BUN 11 10/08/2013 1021   CREATININE 0.77 06/26/2022 1349   CREATININE 0.89 03/25/2014 0938      Component Value Date/Time   CALCIUM 9.1 06/26/2022 1349   CALCIUM 8.4 (L) 10/08/2013 1021   ALKPHOS 65 06/26/2022 1349   ALKPHOS 84 03/25/2014 0938   AST 21 06/26/2022 1349   AST 22 03/25/2014 0938   ALT 18 06/26/2022 1349   ALT 35 03/25/2014 0938   BILITOT 0.6 06/26/2022 1349   BILITOT 0.6 03/25/2014 0938       RADIOGRAPHIC STUDIES: I have personally reviewed the radiological images as listed and agreed with the findings in the report. No results found.   ASSESSMENT & PLAN:  PV (polycythemia vera) ([place]) # Polycythemia vera; JAK-2 positive Today hematocrit is 43. Patient is on Hydrea [2 every days; 1 on sat/sun] and aspirin. Stable.  No concerns for transformation.  # Tolerating Hydrea well; continue Hydrea at  current dose.  #  CT scan [left flank pain/dysuria; FEB 2024]-? Passed stone; incidental Borderline enlarged, 13 cm in greatest dimension. No splenic mass or focal lesion.monitor for now.    # Breast Calcification: on Diagnostic in Jan 2022; September 2022 biopsy negative for malignancy. APRIL 2023- WNL; continue screening mammograms  # DISPOSITION: # follow up in 6 months- MD; labs- cbc/cmp;LDH-dr.B    Orders Placed This Encounter  Procedures   CMP ([facility] only)    Standing Status:   Future    Standing Expiration Date:   06/26/2023   CBC with Differential ([facility] Only)    Standing Status:   Future    Standing Expiration Date:   06/11/2023   Lactate dehydrogenase    Standing Status:   Future    Standing Expiration Date:   06/26/2023   All questions were answered. The patient knows to call the clinic with any problems, questions or concerns.      Cammie Sickle, MD 06/26/2022 2:37 PM

## 2022-06-27 IMAGING — MG DIGITAL SCREENING BILAT W/ TOMO W/ CAD
6 of 10 series · 6 of 30 positions shown · non-contrast
Comparison: Previous exam(s).

CLINICAL DATA: Screening.

EXAM:
DIGITAL SCREENING BILATERAL MAMMOGRAM WITH TOMO AND CAD

[R MLO synth-2D]
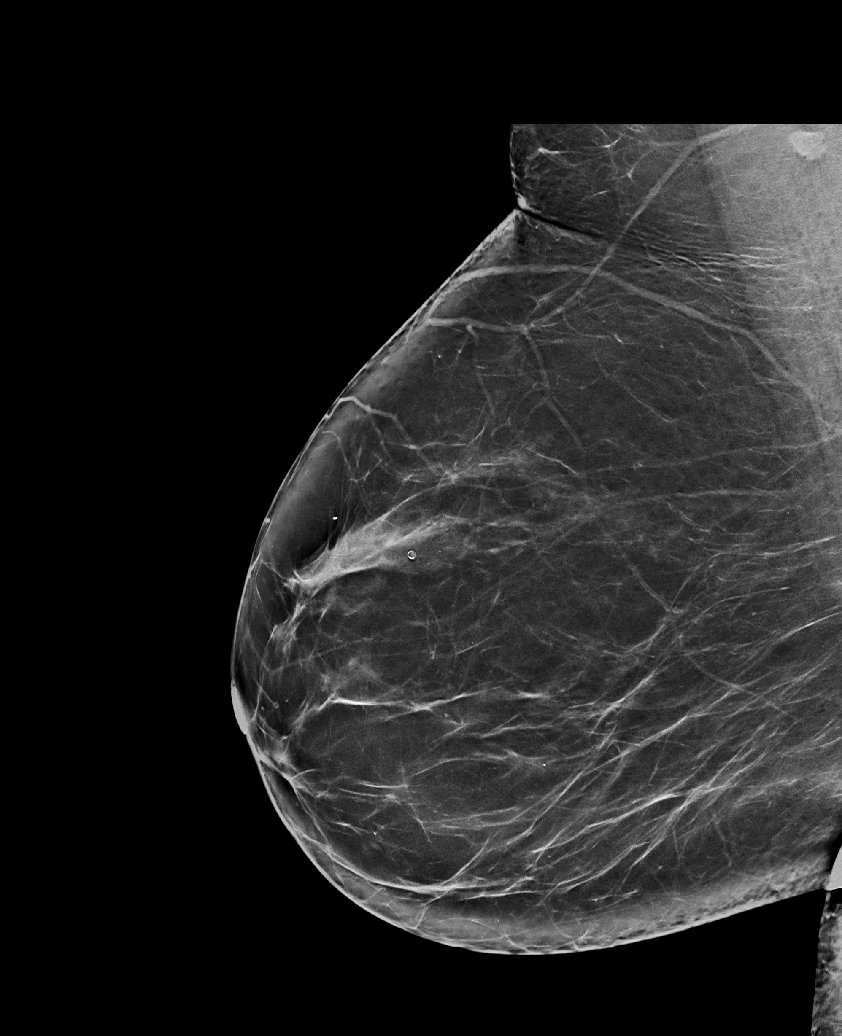

[R CC synth-2D]
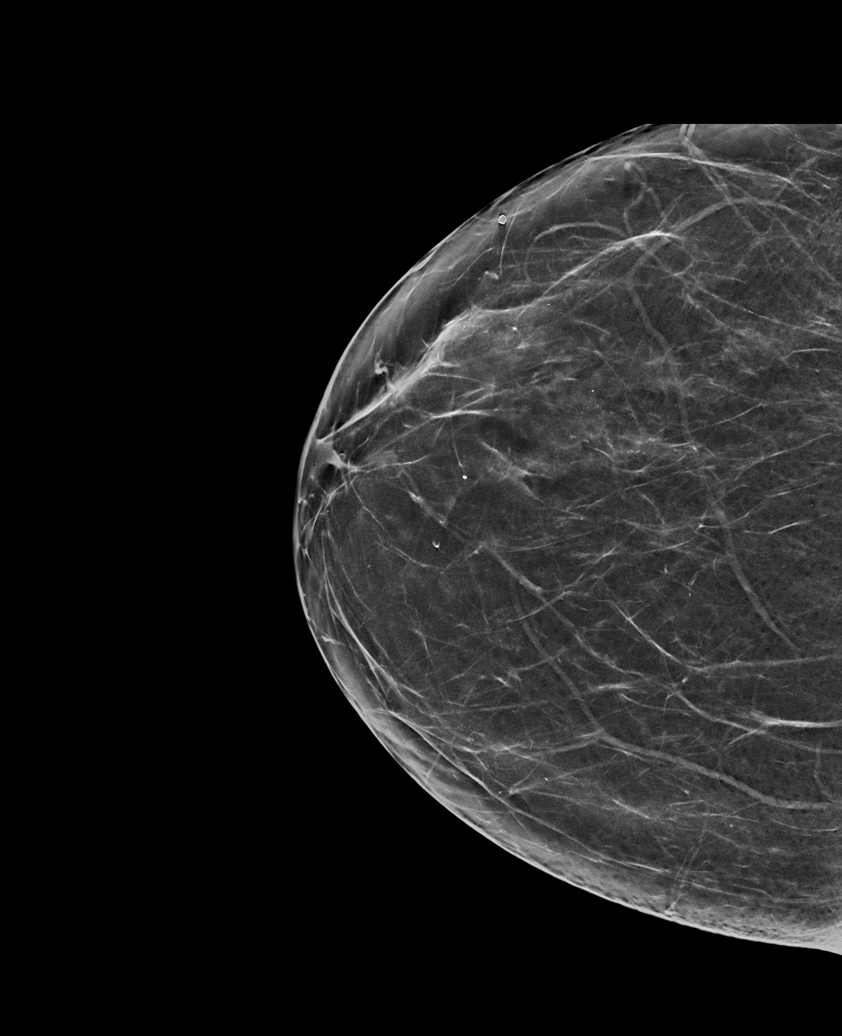

[L CC synth-2D]
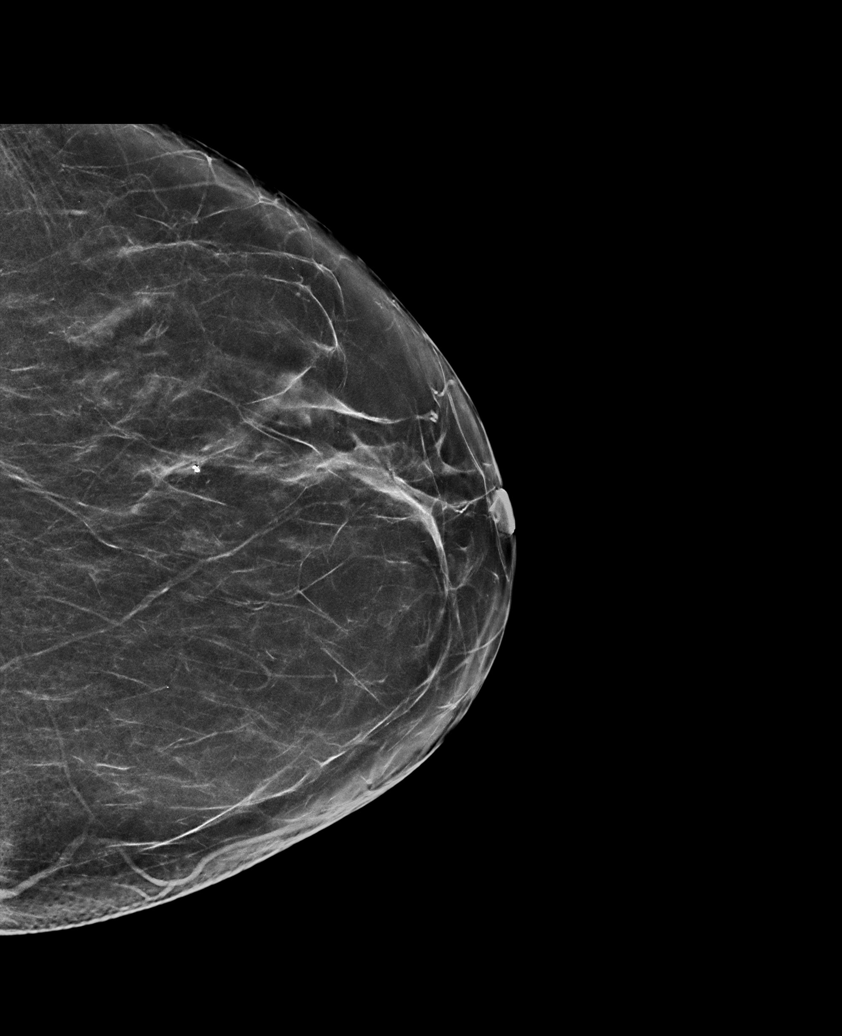

[L MLO synth-2D (1 of 2)]
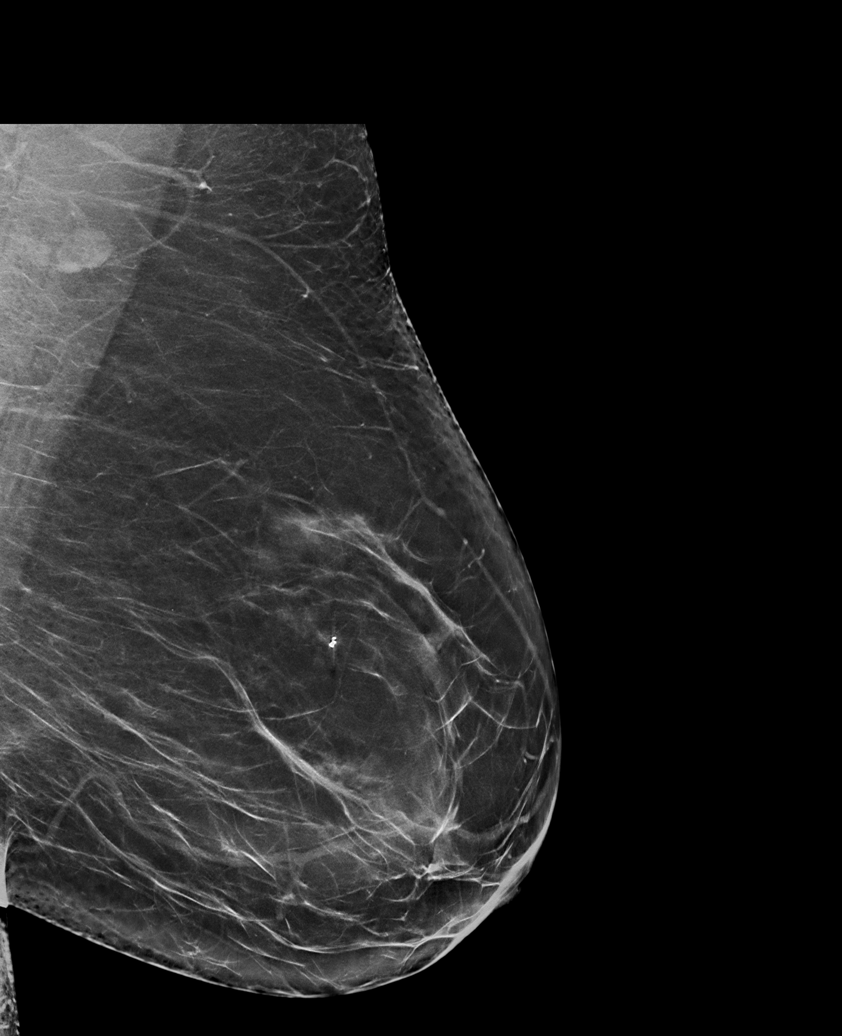

[L MLO synth-2D (2 of 2)]
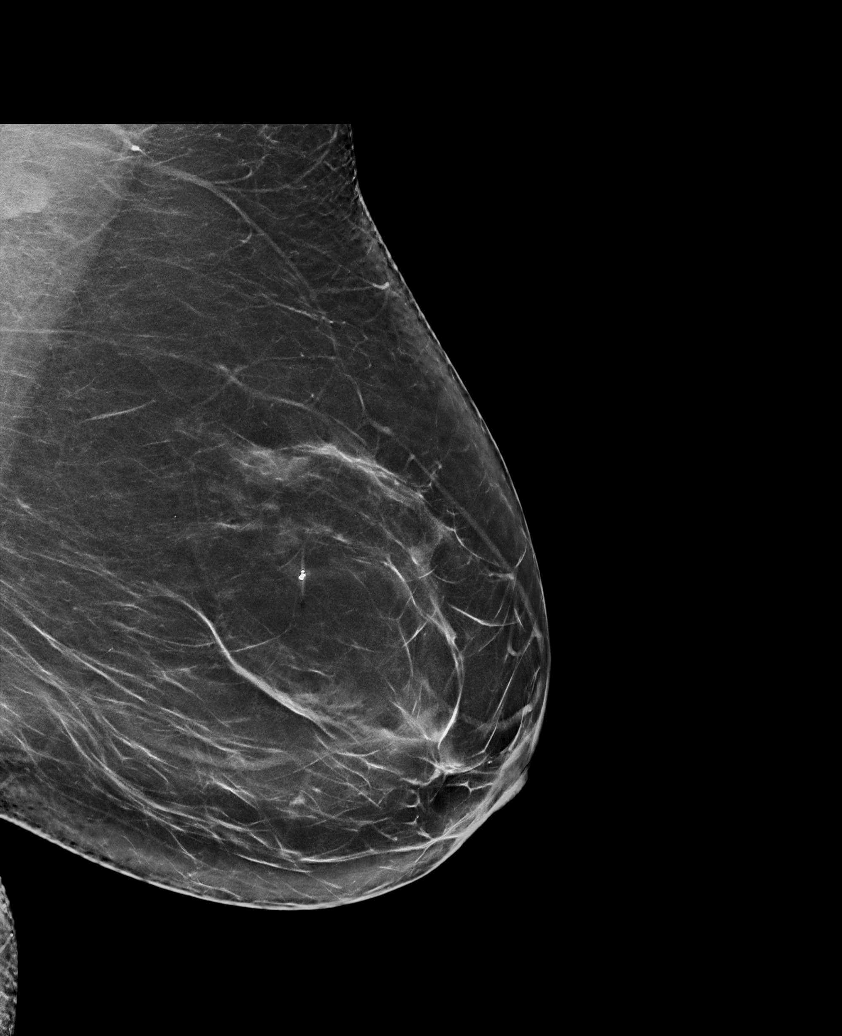

[L MLO tomo · tomo slice 45/88.0]
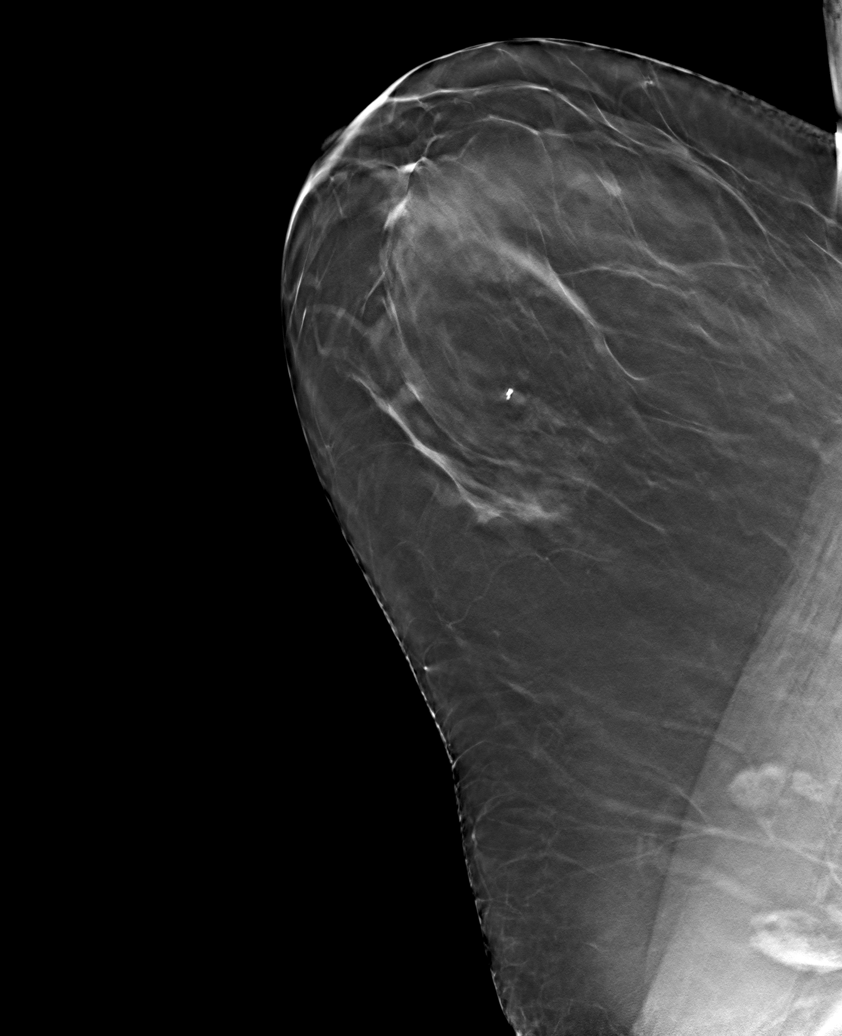

[6 of 30 positions shown; findings below may reference images not displayed]

ACR Breast Density Category b: There are scattered areas of
fibroglandular density.
FINDINGS: In the right breast, calcifications warrant further evaluation with
magnified views. In the left breast, no findings suspicious for
malignancy. Images were processed with CAD.
IMPRESSION: Further evaluation is suggested for calcifications in the right
breast.

RECOMMENDATION:
Diagnostic mammogram of the right breast. (Code:[id number])

The patient will be contacted regarding the findings, and additional
imaging will be scheduled.

BI-RADS CATEGORY  0: Incomplete. Need additional imaging evaluation
and/or prior mammograms for comparison.

## 2022-08-06 ENCOUNTER — Encounter: Payer: Self-pay | Admitting: Internal Medicine

## 2022-08-06 ENCOUNTER — Ambulatory Visit
Admission: RE | Admit: 2022-08-06 | Discharge: 2022-08-06 | Disposition: A | Discharge status: Home / Self Care | Payer: 59 | Source: Ambulatory Visit | Attending: Internal Medicine | Admitting: Internal Medicine

## 2022-08-06 DIAGNOSIS — D45 Polycythemia vera: Secondary | ICD-10-CM | POA: Diagnosis present

## 2022-08-06 DIAGNOSIS — Z1231 Encounter for screening mammogram for malignant neoplasm of breast: Secondary | ICD-10-CM | POA: Diagnosis not present

## 2022-12-14 ENCOUNTER — Other Ambulatory Visit: Payer: Self-pay | Admitting: Medical Oncology

## 2022-12-14 DIAGNOSIS — D45 Polycythemia vera: Secondary | ICD-10-CM

## 2022-12-14 MED ORDER — HYDROXYUREA 500 MG PO CAPS: ORAL_CAPSULE | ORAL | 3 refills | Status: DC

## 2022-12-21 ENCOUNTER — Encounter: Payer: Self-pay | Admitting: Internal Medicine

## 2022-12-28 ENCOUNTER — Encounter: Payer: Self-pay | Admitting: Internal Medicine

## 2022-12-28 ENCOUNTER — Telehealth: Payer: Self-pay | Admitting: *Deleted

## 2022-12-28 ENCOUNTER — Inpatient Hospital Stay: Payer: No Typology Code available for payment source | Attending: Internal Medicine

## 2022-12-28 ENCOUNTER — Inpatient Hospital Stay: Payer: No Typology Code available for payment source | Admitting: Internal Medicine

## 2022-12-28 VITALS — BP 120/80 | HR 94 | Temp 98.2°F | Ht 66.0 in | Wt 219.0 lb

## 2022-12-28 DIAGNOSIS — D45 Polycythemia vera: Secondary | ICD-10-CM | POA: Insufficient documentation

## 2022-12-28 LAB — CBC WITH DIFFERENTIAL (CANCER CENTER ONLY)
Abs Immature Granulocytes: 0.01 10*3/uL (ref 0.00–0.07)
Basophils Absolute: 0 10*3/uL (ref 0.0–0.1)
Basophils Relative: 1 %
Eosinophils Absolute: 0.1 10*3/uL (ref 0.0–0.5)
Eosinophils Relative: 1 %
HCT: 39.5 % (ref 36.0–46.0)
Hemoglobin: 13.7 g/dL (ref 12.0–15.0)
Immature Granulocytes: 0 %
Lymphocytes Relative: 28 %
Lymphs Abs: 1.3 10*3/uL (ref 0.7–4.0)
MCH: 36.3 pg — ABNORMAL HIGH (ref 26.0–34.0)
MCHC: 34.7 g/dL (ref 30.0–36.0)
MCV: 104.8 fL — ABNORMAL HIGH (ref 80.0–100.0)
Monocytes Absolute: 0.4 10*3/uL (ref 0.1–1.0)
Monocytes Relative: 8 %
Neutro Abs: 2.9 10*3/uL (ref 1.7–7.7)
Neutrophils Relative %: 62 %
Platelet Count: 241 10*3/uL (ref 150–400)
RBC: 3.77 MIL/uL — ABNORMAL LOW (ref 3.87–5.11)
RDW: 11.9 % (ref 11.5–15.5)
WBC Count: 4.6 10*3/uL (ref 4.0–10.5)
nRBC: 0 % (ref 0.0–0.2)

## 2022-12-28 LAB — CMP (CANCER CENTER ONLY)
ALT: 19 U/L (ref 0–44)
AST: 18 U/L (ref 15–41)
Albumin: 4.2 g/dL (ref 3.5–5.0)
Alkaline Phosphatase: 72 U/L (ref 38–126)
Anion gap: 7 (ref 5–15)
BUN: 16 mg/dL (ref 6–20)
CO2: 28 mmol/L (ref 22–32)
Calcium: 8.9 mg/dL (ref 8.9–10.3)
Chloride: 102 mmol/L (ref 98–111)
Creatinine: 0.78 mg/dL (ref 0.44–1.00)
GFR, Estimated: >60 mL/min (ref >60)
Glucose, Bld: 94 mg/dL (ref 70–99)
Potassium: 3.8 mmol/L (ref 3.5–5.1)
Sodium: 137 mmol/L (ref 135–145)
Total Bilirubin: 0.5 mg/dL (ref 0.3–1.2)
Total Protein: 6.9 g/dL (ref 6.5–8.1)

## 2022-12-28 LAB — LACTATE DEHYDROGENASE: LDH: 185 U/L (ref 98–192)

## 2022-12-28 NOTE — Progress Notes (Signed)
[facility] OFFICE PROGRESS NOTE  Patient Care Team: Lauro Regulus, MD as PCP - General (Internal Medicine) Earna Coder, MD as Consulting Physician (Internal Medicine)   Cancer Staging  No matching staging information was found for the patient.    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  a. 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 b. 12/23/09: EPO level 2.5 extremely low, Lap score 77, Bcr-Abl -Carbon monoxide level 0.9 %, CLL profile: monoclonal b cell population, c. 12/27/09 : abd U/S: splenomegaly 20 cm. d. 12/29/09: bone marrow biopsy and aspiration: hypercellular marrow with trilineage hematopoiesis.  No malignancy or dysplasia identified in marrow specimen.  24 XY. Patient was started on phlebotomies, but hydroxyurea had to be initiated because of difficulties performing phlebotomies secondary to poor venous access. -----------------------------------    DIAGNOSIS: POLYCYTHEMIA VERA- JAK-2 +  GOALS: control  CURRENT/MOST RECENT THERAPY: Hydrea    PV (polycythemia vera) (HCC)    INTERVAL HISTORY: Alone.  Ambulating independently.  Melissa Patrick 62 y.o.  female pleasant patient above history of Polycythemia vera currently on aspirin and Hydrea-is here for follow-up.   She continues to be compliant with her Hydrea and aspirin.  NO compliants at this time. Compliant with hydrea.   Patient denies any blood clots.  Denies any headaches.  Denies any nausea vomiting. No weight loss.  No diarrhea.  Review of Systems  Constitutional:  Negative for chills, diaphoresis, fever, malaise/fatigue and weight loss.  HENT:  Negative for nosebleeds and sore throat.   Eyes:  Negative for double vision.  Respiratory:  Negative for cough,  hemoptysis, sputum production, shortness of breath and wheezing.   Cardiovascular:  Negative for chest pain, palpitations, orthopnea and leg swelling.  Gastrointestinal:  Negative for abdominal pain, blood in stool, constipation, diarrhea, heartburn, melena, nausea and vomiting.  Genitourinary:  Negative for dysuria, frequency and urgency.  Musculoskeletal:  Negative for back pain and joint pain.  Skin: Negative.  Negative for itching and rash.  Neurological:  Negative for dizziness, tingling, focal weakness, weakness and headaches.  Endo/Heme/Allergies:  Does not bruise/bleed easily.  Psychiatric/Behavioral:  Negative for depression. The patient is not nervous/anxious and does not have insomnia.      PAST MEDICAL HISTORY :  Past Medical History:  Diagnosis Date   PV (polycythemia vera) (HCC) 09/08/2014    PAST SURGICAL HISTORY :   Past Surgical History:  Procedure Laterality Date   APPENDECTOMY     BREAST BIOPSY Right 01/11/2021   Stereo Bx, Ribbon Clip, SCLEROSING ADENOSIS AND FIBROCYSTIC CHANGES   TONSILLECTOMY      FAMILY HISTORY :   Family History  Problem Relation Age of Onset   Cancer Mother 76       breast   Arthritis Mother    Breast cancer Mother    Cancer Sister        skin   Stroke Maternal Uncle    Stroke Maternal Grandmother     SOCIAL HISTORY:   Social History   Tobacco Use   Smoking status: Former    Current packs/day: 0.00    Types: Cigarettes    Quit date: 02/23/2010    Years since quitting: 13.2   Smokeless tobacco: Never  Substance Use Topics   Alcohol use: Yes    Alcohol/week: 0.0  standard drinks of alcohol    Comment: occasional about once a year at most   Drug use: No    ALLERGIES:  is allergic to dextromoramide, penicillin g, and tegaderm ag mesh [silver].  MEDICATIONS:  Current Outpatient Medications  Medication Sig Dispense Refill   aspirin 81 MG tablet Take 81 mg by mouth daily.     Cholecalciferol 50 MCG (2000 UT) TABS Take  2,000 Units by mouth daily.      hydroxyurea (HYDREA) 500 MG capsule TAKE 2 CAPSULES BY MOUTH DAILY FROM MONDAY TO FRIDAY, AND 1 CAPSULE DAILY ON SATURDAY AND SUNDAY 60 capsule 3   No current facility-administered medications for this visit.    PHYSICAL EXAMINATION: ECOG PERFORMANCE STATUS: 0 - Asymptomatic  BP 120/80 (BP Location: Left Arm, Patient Position: Sitting, Cuff Size: Large)   Pulse 94   Temp 98.2 F (36.8 C) (Tympanic)   Ht 5\' 6"  (1.676 m)   Wt 219 lb (99.3 kg)   SpO2 100%   BMI 35.35 kg/m   Filed Weights   12/28/22 1510  Weight: 219 lb (99.3 kg)       Physical Exam HENT:     Head: Normocephalic and atraumatic.     Mouth/Throat:     Pharynx: No oropharyngeal exudate.  Eyes:     Pupils: Pupils are equal, round, and reactive to light.  Cardiovascular:     Rate and Rhythm: Normal rate and regular rhythm.  Pulmonary:     Effort: No respiratory distress.     Breath sounds: No wheezing.  Abdominal:     General: Bowel sounds are normal. There is no distension.     Palpations: Abdomen is soft. There is no mass.     Tenderness: There is no abdominal tenderness. There is no guarding or rebound.  Musculoskeletal:        General: No tenderness. Normal range of motion.     Cervical back: Normal range of motion and neck supple.  Skin:    General: Skin is warm.  Neurological:     Mental Status: She is alert and oriented to person, place, and time.  Psychiatric:        Mood and Affect: Affect normal.      LABORATORY DATA:  I have reviewed the data as listed    Component Value Date/Time   NA 137 12/28/2022 1516   NA 140 10/08/2013 1021   K 3.8 12/28/2022 1516   K 4.3 10/08/2013 1021   CL 102 12/28/2022 1516   CL 103 10/08/2013 1021   CO2 28 12/28/2022 1516   CO2 31 10/08/2013 1021   GLUCOSE 94 12/28/2022 1516   GLUCOSE 98 10/08/2013 1021   BUN 16 12/28/2022 1516   BUN 11 10/08/2013 1021   CREATININE 0.78 12/28/2022 1516   CREATININE 0.89 03/25/2014  0938   CALCIUM 8.9 12/28/2022 1516   CALCIUM 8.4 (L) 10/08/2013 1021   PROT 6.9 12/28/2022 1516   PROT 6.7 03/25/2014 0938   ALBUMIN 4.2 12/28/2022 1516   ALBUMIN 3.7 03/25/2014 0938   AST 18 12/28/2022 1516   ALT 19 12/28/2022 1516   ALT 35 03/25/2014 0938   ALKPHOS 72 12/28/2022 1516   ALKPHOS 84 03/25/2014 0938   BILITOT 0.5 12/28/2022 1516   GFRNONAA >60 12/28/2022 1516   GFRNONAA >60 03/25/2014 0938   GFRNONAA >60 12/31/2013 0937   GFRAA >60 12/29/2019 0926   GFRAA >60 03/25/2014 0938   GFRAA >60 12/31/2013 0937    No results found  for: "SPEP", "UPEP"  Lab Results  Component Value Date   WBC 4.6 12/28/2022   NEUTROABS 2.9 12/28/2022   HGB 13.7 12/28/2022   HCT 39.5 12/28/2022   MCV 104.8 (H) 12/28/2022   PLT 241 12/28/2022      Chemistry      Component Value Date/Time   NA 137 12/28/2022 1516   NA 140 10/08/2013 1021   K 3.8 12/28/2022 1516   K 4.3 10/08/2013 1021   CL 102 12/28/2022 1516   CL 103 10/08/2013 1021   CO2 28 12/28/2022 1516   CO2 31 10/08/2013 1021   BUN 16 12/28/2022 1516   BUN 11 10/08/2013 1021   CREATININE 0.78 12/28/2022 1516   CREATININE 0.89 03/25/2014 0938      Component Value Date/Time   CALCIUM 8.9 12/28/2022 1516   CALCIUM 8.4 (L) 10/08/2013 1021   ALKPHOS 72 12/28/2022 1516   ALKPHOS 84 03/25/2014 0938   AST 18 12/28/2022 1516   ALT 19 12/28/2022 1516   ALT 35 03/25/2014 0938   BILITOT 0.5 12/28/2022 1516       RADIOGRAPHIC STUDIES: I have personally reviewed the radiological images as listed and agreed with the findings in the report. No results found.   ASSESSMENT & PLAN:  PV (polycythemia vera) (HCC) # Polycythemia vera; JAK-2 positive Today hematocrit is 43. Patient is on Hydrea [2 every days; 1 on sat/sun] and aspirin. Stable.  No concerns for transformation.  # Tolerating Hydrea well; continue Hydrea at current dose.  #  CT scan [left flank pain/dysuria; FEB 2024]-? Passed stone; incidental Borderline  enlarged, 13 cm in greatest dimension. No splenic mass or focal lesion.monitor for now.    # ? Ear ache/ neck pain- ? Drainage- s/p ENT- defer re: CT.   # Breast Calcification: on Diagnostic in Jan 2022; September 2022 biopsy negative for malignancy. APRIL 2023- WNL; continue screening mammograms. Screening dermatology evaluation.   # DISPOSITION: # referral to [place] dermatology re: Screening for skin cancers # follow up in 6 months- MD; labs- cbc/cmp;[facility]H-Dr.B    Orders Placed This Encounter  Procedures   CBC with Differential (Cancer Center Only)    Standing Status:   Future    Expected Date:   06/27/2023    Expiration Date:   12/28/2023   CMP (Cancer Center only)    Standing Status:   Future    Expected Date:   06/27/2023    Expiration Date:   12/28/2023   Lactate dehydrogenase    Standing Status:   Future    Expected Date:   06/27/2023    Expiration Date:   12/28/2023   Ambulatory referral to Dermatology    Referral Priority:   Routine    Referral Type:   Consultation    Referral Reason:   Specialty Services Required    Referred to Provider:   Debbrah Alar, MD    Requested Specialty:   Dermatology    Number of Visits Requested:   1   All questions were answered. The patient knows to call the clinic with any problems, questions or concerns.      Earna Coder, MD 05/06/2023 1:44 PM

## 2022-12-28 NOTE — Assessment & Plan Note (Addendum)
#   Polycythemia vera; JAK-2 positive Today hematocrit is 43. Patient is on Hydrea [2 every days; 1 on sat/sun] and aspirin. Stable.  No concerns for transformation.  # Tolerating Hydrea well; continue Hydrea at current dose.  #  CT scan [left flank pain/dysuria; FEB 2024]-? Passed stone; incidental Borderline enlarged, 13 cm in greatest dimension. No splenic mass or focal lesion.monitor for now.    # ? Ear ache/ neck pain- ? Drainage- s/p ENT- defer re: CT.   # Breast Calcification: on Diagnostic in Jan 2022; September 2022 biopsy negative for malignancy. APRIL 2023- WNL; continue screening mammograms. Screening dermatology evaluation.   # DISPOSITION: # referral to [facility] [facility] re: Screening for skin cancers # follow up in 6 months- MD; labs- cbc/cmp;[facility]-Dr.B

## 2022-12-28 NOTE — Telephone Encounter (Signed)
Referral faxed to [facility] dermatology with demographics. Fax # [phone]

## 2023-05-06 ENCOUNTER — Encounter: Payer: Self-pay | Admitting: Internal Medicine

## 2023-06-11 ENCOUNTER — Telehealth: Payer: Self-pay | Admitting: *Deleted

## 2023-06-11 DIAGNOSIS — D45 Polycythemia vera: Secondary | ICD-10-CM

## 2023-06-11 MED ORDER — HYDROXYUREA 500 MG PO CAPS
ORAL_CAPSULE | ORAL | 3 refills | Status: DC
Start: 2023-06-11 — End: 2023-06-12

## 2023-06-11 NOTE — Telephone Encounter (Signed)
 Pt needs refill of hydrea

## 2023-06-12 ENCOUNTER — Other Ambulatory Visit: Payer: Self-pay | Admitting: Internal Medicine

## 2023-06-12 DIAGNOSIS — D45 Polycythemia vera: Secondary | ICD-10-CM

## 2023-06-12 MED ORDER — HYDROXYUREA 500 MG PO CAPS: ORAL_CAPSULE | ORAL | 5 refills | Status: DC

## 2023-06-25 ENCOUNTER — Other Ambulatory Visit: Payer: BC Managed Care – PPO

## 2023-06-25 ENCOUNTER — Ambulatory Visit: Payer: BC Managed Care – PPO | Admitting: Internal Medicine

## 2023-06-27 ENCOUNTER — Inpatient Hospital Stay: Payer: No Typology Code available for payment source | Admitting: Internal Medicine

## 2023-06-27 ENCOUNTER — Inpatient Hospital Stay: Payer: No Typology Code available for payment source | Attending: Internal Medicine

## 2023-06-27 ENCOUNTER — Encounter: Payer: Self-pay | Admitting: Internal Medicine

## 2023-06-27 VITALS — BP 132/68 | HR 75 | Temp 97.8°F | Resp 20 | Wt 227.5 lb

## 2023-06-27 DIAGNOSIS — D45 Polycythemia vera: Secondary | ICD-10-CM

## 2023-06-27 DIAGNOSIS — Z803 Family history of malignant neoplasm of breast: Secondary | ICD-10-CM | POA: Diagnosis not present

## 2023-06-27 LAB — CMP (CANCER CENTER ONLY)
ALT: 16 U/L (ref 0–44)
AST: 16 U/L (ref 15–41)
Albumin: 4.1 g/dL (ref 3.5–5.0)
Alkaline Phosphatase: 58 U/L (ref 38–126)
Anion gap: 7 (ref 5–15)
BUN: 17 mg/dL (ref 6–20)
CO2: 27 mmol/L (ref 22–32)
Calcium: 8.9 mg/dL (ref 8.9–10.3)
Chloride: 105 mmol/L (ref 98–111)
Creatinine: 0.79 mg/dL (ref 0.44–1.00)
GFR, Estimated: >60 mL/min (ref >60)
Glucose, Bld: 89 mg/dL (ref 70–99)
Potassium: 4.1 mmol/L (ref 3.5–5.1)
Sodium: 139 mmol/L (ref 135–145)
Total Bilirubin: 0.8 mg/dL (ref 0.0–1.2)
Total Protein: 6.7 g/dL (ref 6.5–8.1)

## 2023-06-27 LAB — CBC WITH DIFFERENTIAL (CANCER CENTER ONLY)
Abs Immature Granulocytes: 0.01 10*3/uL (ref 0.00–0.07)
Basophils Absolute: 0 10*3/uL (ref 0.0–0.1)
Basophils Relative: 1 %
Eosinophils Absolute: 0.1 10*3/uL (ref 0.0–0.5)
Eosinophils Relative: 1 %
HCT: 37.8 % (ref 36.0–46.0)
Hemoglobin: 13.3 g/dL (ref 12.0–15.0)
Immature Granulocytes: 0 %
Lymphocytes Relative: 24 %
Lymphs Abs: 1 10*3/uL (ref 0.7–4.0)
MCH: 37.8 pg — ABNORMAL HIGH (ref 26.0–34.0)
MCHC: 35.2 g/dL (ref 30.0–36.0)
MCV: 107.4 fL — ABNORMAL HIGH (ref 80.0–100.0)
Monocytes Absolute: 0.4 10*3/uL (ref 0.1–1.0)
Monocytes Relative: 9 %
Neutro Abs: 2.8 10*3/uL (ref 1.7–7.7)
Neutrophils Relative %: 65 %
Platelet Count: 180 10*3/uL (ref 150–400)
RBC: 3.52 MIL/uL — ABNORMAL LOW (ref 3.87–5.11)
RDW: 12.7 % (ref 11.5–15.5)
WBC Count: 4.3 10*3/uL (ref 4.0–10.5)
nRBC: 0 % (ref 0.0–0.2)

## 2023-06-27 LAB — LACTATE DEHYDROGENASE: LDH: 176 U/L (ref 98–192)

## 2023-06-27 NOTE — Progress Notes (Signed)
 [facility] OFFICE PROGRESS NOTE  Patient Care Team: Lauro Regulus, MD as PCP - General (Internal Medicine) Earna Coder, MD as Consulting Physician (Internal Medicine)   Cancer Staging  No matching staging information was found for the patient.   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  a. 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 b. 12/23/09: EPO level 2.5 extremely low, Lap score 77, Bcr-Abl -Carbon monoxide level 0.9 %, CLL profile: monoclonal b cell population, c. 12/27/09 : abd U/S: splenomegaly 20 cm. d. 12/29/09: bone marrow biopsy and aspiration: hypercellular marrow with trilineage hematopoiesis.  No malignancy or dysplasia identified in marrow specimen.  55 XY. Patient was started on phlebotomies, but hydroxyurea had to be initiated because of difficulties performing phlebotomies secondary to poor venous access. -----------------------------------    DIAGNOSIS: POLYCYTHEMIA VERA- JAK-2 +  GOALS: control  CURRENT/MOST RECENT THERAPY: Hydrea    PV (polycythemia vera) (HCC)    INTERVAL HISTORY: Alone.  Ambulating independently.  Melissa Patrick 63 y.o.  female pleasant patient above history of Polycythemia vera currently on aspirin and Hydrea-is here for follow-up.   She continues to be compliant with her Hydrea and aspirin.  NO compliants at this time. Compliant with hydrea.   Patient denies any blood clots.  Denies any headaches.  Denies any nausea vomiting. No weight loss.  No diarrhea.  Review of Systems  Constitutional:  Negative for chills, diaphoresis, fever, malaise/fatigue and weight loss.  HENT:  Negative for nosebleeds and sore throat.   Eyes:  Negative for double vision.  Respiratory:  Negative for cough,  hemoptysis, sputum production, shortness of breath and wheezing.   Cardiovascular:  Negative for chest pain, palpitations, orthopnea and leg swelling.  Gastrointestinal:  Negative for abdominal pain, blood in stool, constipation, diarrhea, heartburn, melena, nausea and vomiting.  Genitourinary:  Negative for dysuria, frequency and urgency.  Musculoskeletal:  Negative for back pain and joint pain.  Skin: Negative.  Negative for itching and rash.  Neurological:  Negative for dizziness, tingling, focal weakness, weakness and headaches.  Endo/Heme/Allergies:  Does not bruise/bleed easily.  Psychiatric/Behavioral:  Negative for depression. The patient is not nervous/anxious and does not have insomnia.      PAST MEDICAL HISTORY :  Past Medical History:  Diagnosis Date   PV (polycythemia vera) (HCC) 09/08/2014    PAST SURGICAL HISTORY :   Past Surgical History:  Procedure Laterality Date   APPENDECTOMY     BREAST BIOPSY Right 01/11/2021   Stereo Bx, Ribbon Clip, SCLEROSING ADENOSIS AND FIBROCYSTIC CHANGES   TONSILLECTOMY      FAMILY HISTORY :   Family History  Problem Relation Age of Onset   Cancer Mother 74       breast   Arthritis Mother    Breast cancer Mother    Cancer Sister        skin   Stroke Maternal Uncle    Stroke Maternal Grandmother     SOCIAL HISTORY:   Social History   Tobacco Use   Smoking status: Former    Current packs/day: 0.00    Types: Cigarettes    Quit date: 02/23/2010    Years since quitting: 13.3   Smokeless tobacco: Never  Substance Use Topics   Alcohol use: Yes    Alcohol/week: 0.0 standard  drinks of alcohol    Comment: occasional about once a year at most   Drug use: No    ALLERGIES:  is allergic to dextromoramide, penicillin g, and tegaderm ag mesh [silver].  MEDICATIONS:  Current Outpatient Medications  Medication Sig Dispense Refill   aspirin 81 MG tablet Take 81 mg by mouth daily.     Cholecalciferol 50 MCG (2000 UT) TABS Take  2,000 Units by mouth daily.      hydroxyurea (HYDREA) 500 MG capsule TAKE 2 CAPSULES BY MOUTH DAILY FROM MONDAY TO FRIDAY, AND 1 CAPSULE DAILY ON SATURDAY AND SUNDAY 60 capsule 5   No current facility-administered medications for this visit.    PHYSICAL EXAMINATION: ECOG PERFORMANCE STATUS: 0 - Asymptomatic  BP 132/68   Pulse 75   Temp 97.8 F (36.6 C)   Resp 20   Wt 227 lb 8.3 oz (103.2 kg)   SpO2 100%   BMI 36.72 kg/m   Filed Weights   06/27/23 0944  Weight: 227 lb 8.3 oz (103.2 kg)        Physical Exam HENT:     Head: Normocephalic and atraumatic.     Mouth/Throat:     Pharynx: No oropharyngeal exudate.  Eyes:     Pupils: Pupils are equal, round, and reactive to light.  Cardiovascular:     Rate and Rhythm: Normal rate and regular rhythm.  Pulmonary:     Effort: No respiratory distress.     Breath sounds: No wheezing.  Abdominal:     General: Bowel sounds are normal. There is no distension.     Palpations: Abdomen is soft. There is no mass.     Tenderness: There is no abdominal tenderness. There is no guarding or rebound.  Musculoskeletal:        General: No tenderness. Normal range of motion.     Cervical back: Normal range of motion and neck supple.  Skin:    General: Skin is warm.  Neurological:     Mental Status: She is alert and oriented to person, place, and time.  Psychiatric:        Mood and Affect: Affect normal.      LABORATORY DATA:  I have reviewed the data as listed    Component Value Date/Time   NA 139 06/27/2023 0935   NA 140 10/08/2013 1021   K 4.1 06/27/2023 0935   K 4.3 10/08/2013 1021   CL 105 06/27/2023 0935   CL 103 10/08/2013 1021   CO2 27 06/27/2023 0935   CO2 31 10/08/2013 1021   GLUCOSE 89 06/27/2023 0935   GLUCOSE 98 10/08/2013 1021   BUN 17 06/27/2023 0935   BUN 11 10/08/2013 1021   CREATININE 0.79 06/27/2023 0935   CREATININE 0.89 03/25/2014 0938   CALCIUM 8.9 06/27/2023 0935   CALCIUM 8.4 (L) 10/08/2013 1021    PROT 6.7 06/27/2023 0935   PROT 6.7 03/25/2014 0938   ALBUMIN 4.1 06/27/2023 0935   ALBUMIN 3.7 03/25/2014 0938   AST 16 06/27/2023 0935   ALT 16 06/27/2023 0935   ALT 35 03/25/2014 0938   ALKPHOS 58 06/27/2023 0935   ALKPHOS 84 03/25/2014 0938   BILITOT 0.8 06/27/2023 0935   GFRNONAA >60 06/27/2023 0935   GFRNONAA >60 03/25/2014 0938   GFRNONAA >60 12/31/2013 0937   GFRAA >60 12/29/2019 0926   GFRAA >60 03/25/2014 0938   GFRAA >60 12/31/2013 0937    No results found for: "SPEP", "UPEP"  Lab Results  Component Value Date  WBC 4.3 06/27/2023   NEUTROABS 2.8 06/27/2023   HGB 13.3 06/27/2023   HCT 37.8 06/27/2023   MCV 107.4 (H) 06/27/2023   PLT 180 06/27/2023      Chemistry      Component Value Date/Time   NA 139 06/27/2023 0935   NA 140 10/08/2013 1021   K 4.1 06/27/2023 0935   K 4.3 10/08/2013 1021   CL 105 06/27/2023 0935   CL 103 10/08/2013 1021   CO2 27 06/27/2023 0935   CO2 31 10/08/2013 1021   BUN 17 06/27/2023 0935   BUN 11 10/08/2013 1021   CREATININE 0.79 06/27/2023 0935   CREATININE 0.89 03/25/2014 0938      Component Value Date/Time   CALCIUM 8.9 06/27/2023 0935   CALCIUM 8.4 (L) 10/08/2013 1021   ALKPHOS 58 06/27/2023 0935   ALKPHOS 84 03/25/2014 0938   AST 16 06/27/2023 0935   ALT 16 06/27/2023 0935   ALT 35 03/25/2014 0938   BILITOT 0.8 06/27/2023 0935       RADIOGRAPHIC STUDIES: I have personally reviewed the radiological images as listed and agreed with the findings in the report. No results found.   ASSESSMENT & PLAN:  PV (polycythemia vera) (HCC) # Polycythemia vera; JAK-2 positive Today hematocrit is 39 Patient is on Hydrea [2 every days; 1 on sat/sun] and aspirin. Stable.  No concerns for transformation. Stable.   # Tolerating Hydrea well; continue Hydrea at current dose.  # DISPOSITION: # follow up in 6 months- MD; labs- cbc/cmp;[facility]-Dr.B    No orders of the defined types were placed in this encounter.  All questions were  answered. The patient knows to call the clinic with any problems, questions or concerns.      Earna Coder, MD 06/27/2023 10:09 AM

## 2023-06-27 NOTE — Addendum Note (Signed)
 Addended by: Clydia Llano on: 06/27/2023 10:17 AM   Modules accepted: Orders

## 2023-06-27 NOTE — Progress Notes (Signed)
 Patient has no concerns

## 2023-06-27 NOTE — Assessment & Plan Note (Addendum)
#   Polycythemia vera; JAK-2 positive Today hematocrit is 39 Patient is on Hydrea [2 every days; 1 on sat/sun] and aspirin. Stable.  No concerns for transformation. Stable.   # Tolerating Hydrea well; continue Hydrea at current dose.  # DISPOSITION: # follow up in 6 months- MD; labs- cbc/cmp;LDH-Dr.B

## 2023-09-24 ENCOUNTER — Other Ambulatory Visit: Payer: Self-pay | Admitting: Internal Medicine

## 2023-09-24 DIAGNOSIS — Z1231 Encounter for screening mammogram for malignant neoplasm of breast: Secondary | ICD-10-CM

## 2023-09-25 ENCOUNTER — Ambulatory Visit
Admission: RE | Admit: 2023-09-25 | Discharge: 2023-09-25 | Disposition: A | Discharge status: Home / Self Care | Source: Ambulatory Visit | Attending: Internal Medicine | Admitting: Internal Medicine

## 2023-09-25 DIAGNOSIS — Z1231 Encounter for screening mammogram for malignant neoplasm of breast: Secondary | ICD-10-CM | POA: Diagnosis present

## 2023-12-30 ENCOUNTER — Encounter: Payer: Self-pay | Admitting: Internal Medicine

## 2023-12-30 ENCOUNTER — Inpatient Hospital Stay: Attending: Internal Medicine

## 2023-12-30 ENCOUNTER — Inpatient Hospital Stay: Admitting: Internal Medicine

## 2023-12-30 VITALS — BP 142/70 | HR 73 | Temp 98.7°F | Resp 20 | Ht 66.0 in | Wt 221.3 lb

## 2023-12-30 DIAGNOSIS — Z87891 Personal history of nicotine dependence: Secondary | ICD-10-CM | POA: Insufficient documentation

## 2023-12-30 DIAGNOSIS — D45 Polycythemia vera: Secondary | ICD-10-CM | POA: Diagnosis not present

## 2023-12-30 DIAGNOSIS — Z808 Family history of malignant neoplasm of other organs or systems: Secondary | ICD-10-CM | POA: Insufficient documentation

## 2023-12-30 DIAGNOSIS — Z803 Family history of malignant neoplasm of breast: Secondary | ICD-10-CM | POA: Insufficient documentation

## 2023-12-30 LAB — CMP (CANCER CENTER ONLY)
ALT: 17 U/L (ref 0–44)
AST: 20 U/L (ref 15–41)
Albumin: 4.1 g/dL (ref 3.5–5.0)
Alkaline Phosphatase: 71 U/L (ref 38–126)
Anion gap: 8 (ref 5–15)
BUN: 19 mg/dL (ref 8–23)
CO2: 25 mmol/L (ref 22–32)
Calcium: 8.8 mg/dL — ABNORMAL LOW (ref 8.9–10.3)
Chloride: 105 mmol/L (ref 98–111)
Creatinine: 0.79 mg/dL (ref 0.44–1.00)
GFR, Estimated: >60 mL/min (ref >60)
Glucose, Bld: 95 mg/dL (ref 70–99)
Potassium: 4.4 mmol/L (ref 3.5–5.1)
Sodium: 138 mmol/L (ref 135–145)
Total Bilirubin: 0.8 mg/dL (ref 0.0–1.2)
Total Protein: 6.6 g/dL (ref 6.5–8.1)

## 2023-12-30 LAB — CBC WITH DIFFERENTIAL (CANCER CENTER ONLY)
Abs Immature Granulocytes: 0.02 K/uL (ref 0.00–0.07)
Basophils Absolute: 0 K/uL (ref 0.0–0.1)
Basophils Relative: 0 %
Eosinophils Absolute: 0 K/uL (ref 0.0–0.5)
Eosinophils Relative: 1 %
HCT: 38.8 % (ref 36.0–46.0)
Hemoglobin: 13.7 g/dL (ref 12.0–15.0)
Immature Granulocytes: 0 %
Lymphocytes Relative: 24 %
Lymphs Abs: 1.2 K/uL (ref 0.7–4.0)
MCH: 38 pg — ABNORMAL HIGH (ref 26.0–34.0)
MCHC: 35.3 g/dL (ref 30.0–36.0)
MCV: 107.5 fL — ABNORMAL HIGH (ref 80.0–100.0)
Monocytes Absolute: 0.4 K/uL (ref 0.1–1.0)
Monocytes Relative: 8 %
Neutro Abs: 3.4 K/uL (ref 1.7–7.7)
Neutrophils Relative %: 67 %
Platelet Count: 208 K/uL (ref 150–400)
RBC: 3.61 MIL/uL — ABNORMAL LOW (ref 3.87–5.11)
RDW: 12 % (ref 11.5–15.5)
WBC Count: 5 K/uL (ref 4.0–10.5)
nRBC: 0 % (ref 0.0–0.2)

## 2023-12-30 LAB — LACTATE DEHYDROGENASE: LDH: 180 U/L (ref 98–192)

## 2023-12-30 MED ORDER — HYDROXYUREA 500 MG PO CAPS: ORAL_CAPSULE | ORAL | 5 refills | Status: DC

## 2023-12-30 NOTE — Addendum Note (Signed)
 Addended by: JOSHUA ALFONSO CROME on: 12/30/2023 11:17 AM   Modules accepted: Orders

## 2023-12-30 NOTE — Progress Notes (Signed)
 Patient has no concerns

## 2023-12-30 NOTE — Progress Notes (Signed)
 [facility] OFFICE PROGRESS NOTE  Patient Care Team: Lenon Layman ORN, MD as PCP - General (Internal Medicine) Rennie Cindy SAUNDERS, MD as Consulting Physician (Internal Medicine)   Cancer Staging  No matching staging information was found for the patient.   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  a. 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 b. 12/23/09: EPO level 2.5 extremely low, Lap score 77, Bcr-Abl -Carbon monoxide level 0.9 %, CLL profile: monoclonal b cell population, c. 12/27/09 : abd U/S: splenomegaly 20 cm. d. 12/29/09: bone marrow biopsy and aspiration: hypercellular marrow with trilineage hematopoiesis.  No malignancy or dysplasia identified in marrow specimen.  10 XY. Patient was started on phlebotomies, but hydroxyurea  had to be initiated because of difficulties performing phlebotomies secondary to poor venous access. -----------------------------------    DIAGNOSIS: POLYCYTHEMIA VERA- JAK-2 +  GOALS: control  CURRENT/MOST RECENT THERAPY: Hydrea     PV (polycythemia vera) (HCC)    INTERVAL HISTORY: Alone.  Ambulating independently.  Melissa Patrick 63 y.o.  female pleasant patient above history of Polycythemia vera currently on aspirin and Hydrea -is here for follow-up.   She continues to be compliant with her Hydrea  and aspirin.  NO compliants at this time. Compliant with hydrea . Patient denies any blood clots.  Denies any headaches.  Denies any nausea vomiting. No weight loss.  No diarrhea.  Review of Systems  Constitutional:  Negative for chills, diaphoresis, fever, malaise/fatigue and weight loss.  HENT:  Negative for nosebleeds and sore throat.   Eyes:  Negative for double vision.  Respiratory:  Negative for cough,  hemoptysis, sputum production, shortness of breath and wheezing.   Cardiovascular:  Negative for chest pain, palpitations, orthopnea and leg swelling.  Gastrointestinal:  Negative for abdominal pain, blood in stool, constipation, diarrhea, heartburn, melena, nausea and vomiting.  Genitourinary:  Negative for dysuria, frequency and urgency.  Musculoskeletal:  Negative for back pain and joint pain.  Skin: Negative.  Negative for itching and rash.  Neurological:  Negative for dizziness, tingling, focal weakness, weakness and headaches.  Endo/Heme/Allergies:  Does not bruise/bleed easily.  Psychiatric/Behavioral:  Negative for depression. The patient is not nervous/anxious and does not have insomnia.     PAST MEDICAL HISTORY :  Past Medical History:  Diagnosis Date   PV (polycythemia vera) (HCC) 09/08/2014    PAST SURGICAL HISTORY :   Past Surgical History:  Procedure Laterality Date   APPENDECTOMY     BREAST BIOPSY Right 01/11/2021   Stereo Bx, Ribbon Clip, SCLEROSING ADENOSIS AND FIBROCYSTIC CHANGES   TONSILLECTOMY     FAMILY HISTORY :   Family History  Problem Relation Age of Onset   Cancer Mother 47       breast   Arthritis Mother    Breast cancer Mother    Cancer Sister        skin   Stroke Maternal Uncle    Stroke Maternal Grandmother     SOCIAL HISTORY:   Social History   Tobacco Use   Smoking status: Former    Current packs/day: 0.00    Types: Cigarettes    Quit date: 02/23/2010    Years since quitting: 13.8   Smokeless tobacco: Never  Substance Use Topics   Alcohol use: Yes    Alcohol/week: 0.0 standard drinks of alcohol  Comment: occasional about once a year at most   Drug use: No    ALLERGIES:  is allergic to dextromoramide, penicillin g, and tegaderm ag mesh [silver].  MEDICATIONS:  Current Outpatient Medications  Medication Sig Dispense Refill   aspirin 81 MG tablet Take 81 mg by mouth daily.     Cholecalciferol 50 MCG (2000 UT) TABS Take 2,000  Units by mouth daily.      hydroxyurea  (HYDREA ) 500 MG capsule TAKE 2 CAPSULES BY MOUTH DAILY FROM MONDAY TO FRIDAY, AND 1 CAPSULE DAILY ON SATURDAY AND SUNDAY 60 capsule 5   No current facility-administered medications for this visit.    PHYSICAL EXAMINATION: ECOG PERFORMANCE STATUS: 0 - Asymptomatic  BP (!) 142/70   Pulse 73   Temp 98.7 F (37.1 C)   Resp 20   Ht 5' 6 (1.676 m)   Wt 221 lb 4.8 oz (100.4 kg)   SpO2 100%   BMI 35.72 kg/m   Filed Weights   12/30/23 1029  Weight: 221 lb 4.8 oz (100.4 kg)        Physical Exam HENT:     Head: Normocephalic and atraumatic.     Mouth/Throat:     Pharynx: No oropharyngeal exudate.  Eyes:     Pupils: Pupils are equal, round, and reactive to light.  Cardiovascular:     Rate and Rhythm: Normal rate and regular rhythm.  Pulmonary:     Effort: No respiratory distress.     Breath sounds: No wheezing.  Abdominal:     General: Bowel sounds are normal. There is no distension.     Palpations: Abdomen is soft. There is no mass.     Tenderness: There is no abdominal tenderness. There is no guarding or rebound.  Musculoskeletal:        General: No tenderness. Normal range of motion.     Cervical back: Normal range of motion and neck supple.  Skin:    General: Skin is warm.  Neurological:     Mental Status: She is alert and oriented to person, place, and time.  Psychiatric:        Mood and Affect: Affect normal.      LABORATORY DATA:  I have reviewed the data as listed    Component Value Date/Time   NA 138 12/30/2023 1039   NA 140 10/08/2013 1021   K 4.4 12/30/2023 1039   K 4.3 10/08/2013 1021   CL 105 12/30/2023 1039   CL 103 10/08/2013 1021   CO2 25 12/30/2023 1039   CO2 31 10/08/2013 1021   GLUCOSE 95 12/30/2023 1039   GLUCOSE 98 10/08/2013 1021   BUN 19 12/30/2023 1039   BUN 11 10/08/2013 1021   CREATININE 0.79 12/30/2023 1039   CREATININE 0.89 03/25/2014 0938   CALCIUM 8.8 (L) 12/30/2023 1039   CALCIUM 8.4  (L) 10/08/2013 1021   PROT 6.6 12/30/2023 1039   PROT 6.7 03/25/2014 0938   ALBUMIN 4.1 12/30/2023 1039   ALBUMIN 3.7 03/25/2014 0938   AST 20 12/30/2023 1039   ALT 17 12/30/2023 1039   ALT 35 03/25/2014 0938   ALKPHOS 71 12/30/2023 1039   ALKPHOS 84 03/25/2014 0938   BILITOT 0.8 12/30/2023 1039   GFRNONAA >60 12/30/2023 1039   GFRNONAA >60 03/25/2014 0938   GFRNONAA >60 12/31/2013 0937   GFRAA >60 12/29/2019 0926   GFRAA >60 03/25/2014 0938   GFRAA >60 12/31/2013 0937    No results found for: SPEP, UPEP  Lab Results  Component Value Date   WBC 5.0 12/30/2023   NEUTROABS 3.4 12/30/2023   HGB 13.7 12/30/2023   HCT 38.8 12/30/2023   MCV 107.5 (H) 12/30/2023   PLT 208 12/30/2023      Chemistry      Component Value Date/Time   NA 138 12/30/2023 1039   NA 140 10/08/2013 1021   K 4.4 12/30/2023 1039   K 4.3 10/08/2013 1021   CL 105 12/30/2023 1039   CL 103 10/08/2013 1021   CO2 25 12/30/2023 1039   CO2 31 10/08/2013 1021   BUN 19 12/30/2023 1039   BUN 11 10/08/2013 1021   CREATININE 0.79 12/30/2023 1039   CREATININE 0.89 03/25/2014 0938      Component Value Date/Time   CALCIUM 8.8 (L) 12/30/2023 1039   CALCIUM 8.4 (L) 10/08/2013 1021   ALKPHOS 71 12/30/2023 1039   ALKPHOS 84 03/25/2014 0938   AST 20 12/30/2023 1039   ALT 17 12/30/2023 1039   ALT 35 03/25/2014 0938   BILITOT 0.8 12/30/2023 1039       RADIOGRAPHIC STUDIES: I have personally reviewed the radiological images as listed and agreed with the findings in the report. No results found.   ASSESSMENT & PLAN:  PV (polycythemia vera) (HCC) # Polycythemia vera; JAK-2 positive Today hematocrit is < 45 Patient is on Hydrea  [2 every days; 1 on sat/sun] and aspirin.  No concerns for transformation. Stable.   # Tolerating asprin and  Hydrea  well; continue Hydrea  at current dose.  Refilled the Hydrea .  # hypocalcemia- reocmmend vit D 1000-2000 units a day.  Discussed that this is unlikely to be a  contributor to her history of breast calcifications [2022 s/p biopsy negative for malignancy]  # DISPOSITION: # follow up in 6 months- MD; labs- cbc/cmp;LDH-Dr.B    No orders of the defined types were placed in this encounter.  All questions were answered. The patient knows to call the clinic with any problems, questions or concerns.      Cindy JONELLE Joe, MD 12/30/2023 11:12 AM

## 2023-12-30 NOTE — Assessment & Plan Note (Addendum)
#   Polycythemia vera; JAK-2 positive Today hematocrit is < 45 Patient is on Hydrea  [2 every days; 1 on sat/sun] and aspirin.  No concerns for transformation. Stable.   # Tolerating asprin and  Hydrea  well; continue Hydrea  at current dose.  Refilled the Hydrea .  # hypocalcemia- reocmmend vit D 1000-2000 units a day.  Discussed that this is unlikely to be a contributor to her history of breast calcifications [2022 s/p biopsy negative for malignancy]  # DISPOSITION: # follow up in 6 months- MD; labs- cbc/cmp;LD[facility]-Dr.B

## 2024-01-03 ENCOUNTER — Ambulatory Visit: Admitting: Certified Registered"

## 2024-01-03 ENCOUNTER — Other Ambulatory Visit: Payer: Self-pay

## 2024-01-03 ENCOUNTER — Encounter: Payer: Self-pay | Admitting: Gastroenterology

## 2024-01-03 ENCOUNTER — Encounter
Admission: RE | Disposition: A | Discharge status: Home / Self Care | Payer: Self-pay | Source: Home / Self Care | Attending: Gastroenterology

## 2024-01-03 ENCOUNTER — Ambulatory Visit
Admission: RE | Admit: 2024-01-03 | Discharge: 2024-01-03 | Disposition: A | Discharge status: Home / Self Care | Attending: Gastroenterology | Admitting: Gastroenterology

## 2024-01-03 DIAGNOSIS — K621 Rectal polyp: Secondary | ICD-10-CM | POA: Diagnosis not present

## 2024-01-03 DIAGNOSIS — K5730 Diverticulosis of large intestine without perforation or abscess without bleeding: Secondary | ICD-10-CM | POA: Diagnosis not present

## 2024-01-03 DIAGNOSIS — D123 Benign neoplasm of transverse colon: Secondary | ICD-10-CM | POA: Diagnosis not present

## 2024-01-03 DIAGNOSIS — Z87891 Personal history of nicotine dependence: Secondary | ICD-10-CM | POA: Insufficient documentation

## 2024-01-03 DIAGNOSIS — Z1211 Encounter for screening for malignant neoplasm of colon: Secondary | ICD-10-CM | POA: Diagnosis present

## 2024-01-03 HISTORY — PX: POLYPECTOMY: SHX149

## 2024-01-03 HISTORY — PX: COLONOSCOPY: SHX5424

## 2024-01-03 SURGERY — COLONOSCOPY
Anesthesia: General

## 2024-01-03 MED ORDER — SODIUM CHLORIDE 0.9 % IV SOLN: INTRAVENOUS | Status: DC

## 2024-01-03 MED ORDER — LIDOCAINE HCL (CARDIAC) PF 100 MG/5ML IV SOSY
PREFILLED_SYRINGE | INTRAVENOUS | Status: DC | PRN
  Administered 2024-01-03: 100 mg via INTRAVENOUS

## 2024-01-03 MED ORDER — PHENYLEPHRINE HCL-NACL 20-0.9 MG/250ML-% IV SOLN
INTRAVENOUS | Status: DC | PRN
  Administered 2024-01-03: 80 ug via INTRAVENOUS
  Administered 2024-01-03: 160 ug via INTRAVENOUS

## 2024-01-03 MED ORDER — PROPOFOL 500 MG/50ML IV EMUL
INTRAVENOUS | Status: DC | PRN
  Administered 2024-01-03: 50 mg via INTRAVENOUS
  Administered 2024-01-03: 150 ug/kg/min via INTRAVENOUS

## 2024-01-03 NOTE — Anesthesia Postprocedure Evaluation (Signed)
 Anesthesia Post Note  Patient: Melissa Patrick  Procedure(s) Performed: COLONOSCOPY POLYPECTOMY, INTESTINE  Patient location during evaluation: Endoscopy Anesthesia Type: General Level of consciousness: awake and alert Pain management: pain level controlled Vital Signs Assessment: post-procedure vital signs reviewed and stable Respiratory status: spontaneous breathing, nonlabored ventilation and respiratory function stable Cardiovascular status: blood pressure returned to baseline and stable Postop Assessment: no apparent nausea or vomiting Anesthetic complications: no   There were no known notable events for this encounter.   Last Vitals:  Vitals:   01/03/24 0938 01/03/24 0944  BP: 105/71 113/79  Pulse: 63 (!) 59  Resp: 16 16  Temp:    SpO2:      Last Pain:  Vitals:   01/03/24 0944  PainSc: 0-No pain                 Fairy POUR Rusell Meneely

## 2024-01-03 NOTE — H&P (Signed)
 Pre-Procedure H&P   Patient ID: Melissa Patrick is a 63 y.o. female.  Gastroenterology Provider: Elspeth Ozell Jungling, DO  Referring Provider: Dr. Lenon PCP: Lenon Layman ORN, MD  Date: 01/03/2024  HPI Ms. Melissa Patrick is a 63 y.o. female who presents today for Colonoscopy for Colorectal cancer screening .  Last underwent colonoscopy in June 15, 2013 which was reportedly normal aside from sigmoid diverticulosis.  Restricted mobility of the colon requiring change to pediatric scope  No family history of colon cancer or colon polyps  Bowels move 2-3 times a day without melena or hematochezia.   Past Medical History:  Diagnosis Date   PV (polycythemia vera) (HCC) 09/08/2014    Past Surgical History:  Procedure Laterality Date   APPENDECTOMY     BREAST BIOPSY Right 01/11/2021   Stereo Bx, Ribbon Clip, SCLEROSING ADENOSIS AND FIBROCYSTIC CHANGES   TONSILLECTOMY      Family History No h/o GI disease or malignancy  Review of Systems  Constitutional:  Negative for activity change, appetite change, chills, diaphoresis, fatigue, fever and unexpected weight change.  HENT:  Negative for trouble swallowing and voice change.   Respiratory:  Negative for shortness of breath and wheezing.   Cardiovascular:  Negative for chest pain, palpitations and leg swelling.  Gastrointestinal:  Negative for abdominal distention, abdominal pain, anal bleeding, blood in stool, constipation, diarrhea, nausea, rectal pain and vomiting.  Musculoskeletal:  Negative for arthralgias and myalgias.  Skin:  Negative for color change and pallor.  Neurological:  Negative for dizziness, syncope and weakness.  Psychiatric/Behavioral:  Negative for confusion.   All other systems reviewed and are negative.    Medications No current facility-administered medications on file prior to encounter.   Current Outpatient Medications on File Prior to Encounter  Medication Sig Dispense  Refill   aspirin 81 MG tablet Take 81 mg by mouth daily.     Cholecalciferol 50 MCG (2000 UT) TABS Take 2,000 Units by mouth daily.       Pertinent medications related to GI and procedure were reviewed by me with the patient prior to the procedure   Current Facility-Administered Medications:    0.9 %  sodium chloride  infusion, , Intravenous, Continuous, Jungling Elspeth Ozell, DO, Last Rate: 20 mL/hr at 01/03/24 0841, New Bag at 01/03/24 0841  sodium chloride  20 mL/hr at 01/03/24 9158       Allergies  Allergen Reactions   Dextromoramide     Other reaction(s): Cough   Penicillin G Hives   Tegaderm Ag Mesh [Silver] Other (See Comments)    Caused skin blistering   Allergies were reviewed by me prior to the procedure  Objective   Body mass index is 34.86 kg/m. Vitals:   01/03/24 0827 01/03/24 0831  BP: 93/73 101/71  Pulse: 63   Resp: 14   Temp: (!) 97.5 F (36.4 C)   SpO2: 100%   Weight: 98 kg      Physical Exam Vitals and nursing note reviewed.  Constitutional:      General: She is not in acute distress.    Appearance: Normal appearance. She is obese. She is not ill-appearing, toxic-appearing or diaphoretic.  HENT:     Head: Normocephalic and atraumatic.     Nose: Nose normal.     Mouth/Throat:     Mouth: Mucous membranes are moist.     Pharynx: Oropharynx is clear.  Eyes:     General: No scleral icterus.    Extraocular Movements: Extraocular movements intact.  Cardiovascular:     Rate and Rhythm: Normal rate and regular rhythm.     Heart sounds: Normal heart sounds. No murmur heard.    No friction rub. No gallop.  Pulmonary:     Effort: Pulmonary effort is normal. No respiratory distress.     Breath sounds: Normal breath sounds. No wheezing, rhonchi or rales.  Abdominal:     General: Bowel sounds are normal. There is no distension.     Palpations: Abdomen is soft.     Tenderness: There is no abdominal tenderness. There is no guarding or rebound.   Musculoskeletal:     Cervical back: Neck supple.     Right lower leg: No edema.     Left lower leg: No edema.  Skin:    General: Skin is warm and dry.     Coloration: Skin is not jaundiced or pale.  Neurological:     General: No focal deficit present.     Mental Status: She is alert and oriented to person, place, and time. Mental status is at baseline.  Psychiatric:        Mood and Affect: Mood normal.        Behavior: Behavior normal.        Thought Content: Thought content normal.        Judgment: Judgment normal.      Assessment:  Ms. Melissa Patrick is a 63 y.o. female  who presents today for Colonoscopy for Colorectal cancer screening .  Plan:  Colonoscopy with possible intervention today  Colonoscopy with possible biopsy, control of bleeding, polypectomy, and interventions as necessary has been discussed with the patient/patient representative. Informed consent was obtained from the patient/patient representative after explaining the indication, nature, and risks of the procedure including but not limited to death, bleeding, perforation, missed neoplasm/lesions, cardiorespiratory compromise, and reaction to medications. Opportunity for questions was given and appropriate answers were provided. Patient/patient representative has verbalized understanding is amenable to undergoing the procedure.   Elspeth Ozell Jungling, DO  [facility]  Portions of the record may have been created with voice recognition software. Occasional wrong-word or 'sound-a-like' substitutions may have occurred due to the inherent limitations of voice recognition software.  Read the chart carefully and recognize, using context, where substitutions may have occurred.

## 2024-01-03 NOTE — Interval H&P Note (Signed)
 History and Physical Interval Note: Preprocedure H&P from 01/03/24  was reviewed and there was no interval change after seeing and examining the patient.  Written consent was obtained from the patient after discussion of risks, benefits, and alternatives. Patient has consented to proceed with Colonoscopy with possible intervention   01/03/2024 8:50 AM  Rollene Slough Vanderford  has presented today for surgery, with the diagnosis of Screening for colon cancer (Z12.11).  The various methods of treatment have been discussed with the patient and family. After consideration of risks, benefits and other options for treatment, the patient has consented to  Procedure(s): COLONOSCOPY (N/A) as a surgical intervention.  The patient's history has been reviewed, patient examined, no change in status, stable for surgery.  I have reviewed the patient's chart and labs.  Questions were answered to the patient's satisfaction.     Elspeth Ozell Jungling

## 2024-01-03 NOTE — Transfer of Care (Signed)
 Immediate Anesthesia Transfer of Care Note  Patient: Melissa Patrick  Procedure(s) Performed: COLONOSCOPY POLYPECTOMY, INTESTINE  Patient Location: PACU  Anesthesia Type:General  Level of Consciousness: awake and patient cooperative  Airway & Oxygen Therapy: Patient Spontanous Breathing  Post-op Assessment: Report given to RN and Post -op Vital signs reviewed and stable  Post vital signs: stable  Last Vitals:  Vitals Value Taken Time  BP 90/67 01/03/24 09:23  Temp 36.1 C 01/03/24 09:23  Pulse 61 01/03/24 09:23  Resp 16 01/03/24 09:23  SpO2      Last Pain:  Vitals:   01/03/24 0923  PainSc: 0-No pain         Complications: No notable events documented.

## 2024-01-03 NOTE — Op Note (Signed)
 [facility] Patient Name: Melissa Patrick Procedure Date: 01/03/2024 8:46 AM MRN: [id number] Account #: [id number] Date of Birth: 02-23-1961 Admit Type: Outpatient Age: 63 Room: [facility] ENDO ROOM 1 Gender: Female Note Status: Finalized Instrument Name: Peds Colonoscope [id number] Procedure:             Colonoscopy Indications:           Screening for colorectal malignant neoplasm Providers:             Elspeth Ozell Onita ROSALEA, DO Referring MD:          Layman ORN. Lenon MD, MD (Referring MD) Medicines:             Monitored Anesthesia Care Complications:         No immediate complications. Estimated blood loss:                         Minimal. Procedure:             Pre-Anesthesia Assessment:                        - Prior to the procedure, a History and Physical was                         performed, and patient medications and allergies were                         reviewed. The patient is competent. The risks and                         benefits of the procedure and the sedation options and                         risks were discussed with the patient. All questions                         were answered and informed consent was obtained.                         Patient identification and proposed procedure were                         verified by the physician, the nurse, the anesthetist                         and the technician in the endoscopy suite. Mental                         Status Examination: alert and oriented. Airway                         Examination: normal oropharyngeal airway and neck                         mobility. Respiratory Examination: clear to                         auscultation. CV Examination: RRR, no murmurs, no S3  or S4. Prophylactic Antibiotics: The patient does not                         require prophylactic antibiotics. Prior                         Anticoagulants: The patient has taken no  anticoagulant                         or antiplatelet agents. ASA Grade Assessment: II - A                         patient with mild systemic disease. After reviewing                         the risks and benefits, the patient was deemed in                         satisfactory condition to undergo the procedure. The                         anesthesia plan was to use monitored anesthesia care                         (MAC). Immediately prior to administration of                         medications, the patient was re-assessed for adequacy                         to receive sedatives. The heart rate, respiratory                         rate, oxygen saturations, blood pressure, adequacy of                         pulmonary ventilation, and response to care were                         monitored throughout the procedure. The physical                         status of the patient was re-assessed after the                         procedure.                        After obtaining informed consent, the colonoscope was                         passed under direct vision. Throughout the procedure,                         the patient's blood pressure, pulse, and oxygen                         saturations were monitored continuously. The  Colonoscope was introduced through the anus and                         advanced to the the terminal ileum, with                         identification of the appendiceal orifice and IC                         valve. The colonoscopy was performed without                         difficulty. The patient tolerated the procedure well.                         The quality of the bowel preparation was evaluated                         using the BBPS [facility] Bowel Preparation Scale) with                         scores of: Right Colon = 3, Transverse Colon = 3 and                         Left Colon = 3 (entire mucosa seen well with no                          residual staining, small fragments of stool or opaque                         liquid). The total BBPS score equals 9. The terminal                         ileum, ileocecal valve, appendiceal orifice, and                         rectum were photographed. Findings:      The perianal and digital rectal examinations were normal. Pertinent       negatives include normal sphincter tone.      The terminal ileum appeared normal. Estimated blood loss: none.      Retroflexion in the right colon was performed.      Multiple small-mouthed diverticula were found in the entire colon. Most       prevalent in the sigmoid colon.      Three sessile polyps were found in the rectum, transverse colon and       cecum. The polyps were 1 to 2 mm in size. These polyps were removed with       a jumbo cold forceps. Resection and retrieval were complete. Estimated       blood loss was minimal.      The exam was otherwise without abnormality on direct and retroflexion       views. Impression:            - The examined portion of the ileum was normal.                        - Diverticulosis in the entire examined colon.                        -  Three 1 to 2 mm polyps in the rectum, in the                         transverse colon and in the cecum, removed with a                         jumbo cold forceps. Resected and retrieved.                        - The examination was otherwise normal on direct and                         retroflexion views. Recommendation:        - Patient has a contact number available for                         emergencies. The signs and symptoms of potential                         delayed complications were discussed with the patient.                         Return to normal activities tomorrow. Written                         discharge instructions were provided to the patient.                        - Discharge patient to home.                        - Resume previous diet.                         - Continue present medications.                        - Await pathology results.                        - Repeat colonoscopy for surveillance based on                         pathology results.                        - Return to referring physician as previously                         scheduled.                        - The findings and recommendations were discussed with                         the patient. Procedure Code(s):     --- Professional ---                        [phone], Colonoscopy, flexible; with biopsy, single or  multiple Diagnosis Code(s):     --- Professional ---                        Z12.11, Encounter for screening for malignant neoplasm                         of colon                        D12.8, Benign neoplasm of rectum                        D12.3, Benign neoplasm of transverse colon (hepatic                         flexure or splenic flexure)                        D12.0, Benign neoplasm of cecum                        K57.30, Diverticulosis of large intestine without                         perforation or abscess without bleeding CPT copyright 2022 [employer]. All rights reserved. The codes documented in this report are preliminary and upon coder review may  be revised to meet current compliance requirements. Attending Participation:      I personally performed the entire procedure. Elspeth Jungling, DO Elspeth Ozell Jungling DO, DO 01/03/2024 9:29:02 AM This report has been signed electronically. Number of Addenda: 0 Note Initiated On: 01/03/2024 8:46 AM Scope Withdrawal Time: 0 hours 17 minutes 33 seconds  Total Procedure Duration: 0 hours 21 minutes 28 seconds  Estimated Blood Loss:  Estimated blood loss was minimal.      [facility]

## 2024-01-03 NOTE — Anesthesia Preprocedure Evaluation (Signed)
 Anesthesia Evaluation  Patient identified by MRN, date of birth, ID band Patient awake    Reviewed: Allergy & Precautions, NPO status , Patient's Chart, lab work & pertinent test results  History of Anesthesia Complications Negative for: history of anesthetic complications  Airway Mallampati: III  TM Distance: <3 FB Neck ROM: full    Dental  (+) Chipped   Pulmonary neg shortness of breath, former smoker   Pulmonary exam normal        Cardiovascular Exercise Tolerance: Good negative cardio ROS Normal cardiovascular exam     Neuro/Psych negative neurological ROS  negative psych ROS   GI/Hepatic negative GI ROS, Neg liver ROS,neg GERD  ,,  Endo/Other  negative endocrine ROS    Renal/GU negative Renal ROS  negative genitourinary   Musculoskeletal   Abdominal   Peds  Hematology negative hematology ROS (+)   Anesthesia Other Findings Past Medical History: 09/08/2014: PV (polycythemia vera) (HCC)  Past Surgical History: No date: APPENDECTOMY 01/11/2021: BREAST BIOPSY; Right     Comment:  Stereo Bx, Ribbon Clip, SCLEROSING ADENOSIS AND               FIBROCYSTIC CHANGES No date: TONSILLECTOMY  BMI    Body Mass Index: 34.86 kg/m      Reproductive/Obstetrics negative OB ROS                              Anesthesia Physical Anesthesia Plan  ASA: 2  Anesthesia Plan: General   Post-op Pain Management:    Induction: Intravenous  PONV Risk Score and Plan: Propofol  infusion and TIVA  Airway Management Planned: Natural Airway and Nasal Cannula  Additional Equipment:   Intra-op Plan:   Post-operative Plan:   Informed Consent: I have reviewed the patients History and Physical, chart, labs and discussed the procedure including the risks, benefits and alternatives for the proposed anesthesia with the patient or authorized representative who has indicated his/her understanding and  acceptance.     Dental Advisory Given  Plan Discussed with: Anesthesiologist, CRNA and Surgeon  Anesthesia Plan Comments: (Patient consented for risks of anesthesia including but not limited to:  - adverse reactions to medications - risk of airway placement if required - damage to eyes, teeth, lips or other oral mucosa - nerve damage due to positioning  - sore throat or hoarseness - Damage to heart, brain, nerves, lungs, other parts of body or loss of life  Patient voiced understanding and assent.)        Anesthesia Quick Evaluation

## 2024-01-06 LAB — SURGICAL PATHOLOGY

## 2024-02-14 ENCOUNTER — Encounter: Payer: Self-pay | Admitting: Internal Medicine

## 2024-03-20 ENCOUNTER — Other Ambulatory Visit: Payer: Self-pay | Admitting: Internal Medicine

## 2024-03-20 DIAGNOSIS — D45 Polycythemia vera: Secondary | ICD-10-CM

## 2024-06-30 ENCOUNTER — Other Ambulatory Visit

## 2024-06-30 ENCOUNTER — Ambulatory Visit: Admitting: Internal Medicine
